# Patient Record
Sex: Male | Born: 1960 | Race: White | Hispanic: No | Marital: Married | Smoking: Light tobacco smoker
Health system: Southern US, Community
[De-identification: ages and names within clinical notes are randomized; demographics above are authoritative.]

## PROBLEM LIST (undated history)

## (undated) DIAGNOSIS — E785 Hyperlipidemia, unspecified: Secondary | ICD-10-CM

## (undated) DIAGNOSIS — G4733 Obstructive sleep apnea (adult) (pediatric): Secondary | ICD-10-CM

## (undated) DIAGNOSIS — L409 Psoriasis, unspecified: Secondary | ICD-10-CM

## (undated) DIAGNOSIS — E669 Obesity, unspecified: Secondary | ICD-10-CM

## (undated) DIAGNOSIS — R748 Abnormal levels of other serum enzymes: Secondary | ICD-10-CM

## (undated) DIAGNOSIS — I2119 ST elevation (STEMI) myocardial infarction involving other coronary artery of inferior wall: Secondary | ICD-10-CM

## (undated) DIAGNOSIS — I219 Acute myocardial infarction, unspecified: Secondary | ICD-10-CM

## (undated) DIAGNOSIS — I10 Essential (primary) hypertension: Secondary | ICD-10-CM

## (undated) HISTORY — DX: ST elevation (STEMI) myocardial infarction involving other coronary artery of inferior wall: I21.19

## (undated) HISTORY — DX: Obesity, unspecified: E66.9

## (undated) HISTORY — DX: Abnormal levels of other serum enzymes: R74.8

## (undated) HISTORY — DX: Essential (primary) hypertension: I10

## (undated) HISTORY — DX: Obstructive sleep apnea (adult) (pediatric): G47.33

## (undated) HISTORY — DX: Hyperlipidemia, unspecified: E78.5

## (undated) HISTORY — DX: Psoriasis, unspecified: L40.9

---
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
## 1993-07-14 HISTORY — PX: VASECTOMY: SHX75

## 1998-07-14 HISTORY — PX: HERNIA REPAIR: SHX51

## 2004-07-14 DIAGNOSIS — R748 Abnormal levels of other serum enzymes: Secondary | ICD-10-CM

## 2004-07-14 HISTORY — DX: Abnormal levels of other serum enzymes: R74.8

## 2004-12-28 ENCOUNTER — Emergency Department: Payer: Self-pay | Admitting: Emergency Medicine

## 2004-12-30 ENCOUNTER — Ambulatory Visit: Payer: Self-pay | Admitting: Emergency Medicine

## 2006-01-08 ENCOUNTER — Ambulatory Visit: Payer: Self-pay | Admitting: Cardiovascular Disease

## 2006-03-11 ENCOUNTER — Encounter: Payer: Self-pay | Admitting: Cardiovascular Disease

## 2006-03-14 ENCOUNTER — Encounter: Payer: Self-pay | Admitting: Cardiovascular Disease

## 2006-04-13 ENCOUNTER — Encounter: Payer: Self-pay | Admitting: Cardiovascular Disease

## 2006-04-29 ENCOUNTER — Ambulatory Visit: Payer: Self-pay | Admitting: Gastroenterology

## 2006-05-14 ENCOUNTER — Encounter: Payer: Self-pay | Admitting: Cardiovascular Disease

## 2006-06-13 ENCOUNTER — Encounter: Payer: Self-pay | Admitting: Cardiovascular Disease

## 2007-10-25 ENCOUNTER — Other Ambulatory Visit: Payer: Self-pay

## 2007-10-26 ENCOUNTER — Inpatient Hospital Stay: Payer: Self-pay | Admitting: Internal Medicine

## 2007-12-02 ENCOUNTER — Ambulatory Visit: Payer: Self-pay | Admitting: Surgery

## 2009-02-11 HISTORY — PX: COLONOSCOPY: SHX174

## 2009-03-12 LAB — HM COLONOSCOPY: HM Colonoscopy: NORMAL

## 2009-04-24 ENCOUNTER — Inpatient Hospital Stay: Payer: Self-pay | Admitting: Internal Medicine

## 2009-06-21 ENCOUNTER — Ambulatory Visit: Payer: Self-pay | Admitting: Gastroenterology

## 2009-10-26 ENCOUNTER — Ambulatory Visit: Payer: Self-pay | Admitting: Internal Medicine

## 2010-06-21 ENCOUNTER — Other Ambulatory Visit: Payer: Self-pay | Admitting: Internal Medicine

## 2011-01-23 ENCOUNTER — Other Ambulatory Visit: Payer: Self-pay | Admitting: Internal Medicine

## 2011-03-13 ENCOUNTER — Ambulatory Visit (INDEPENDENT_AMBULATORY_CARE_PROVIDER_SITE_OTHER): Payer: BC Managed Care – PPO | Admitting: Internal Medicine

## 2011-03-13 ENCOUNTER — Encounter: Payer: Self-pay | Admitting: Internal Medicine

## 2011-03-13 VITALS — BP 121/75 | HR 72 | Temp 98.5°F | Resp 16 | Ht 71.0 in | Wt 346.0 lb

## 2011-03-13 DIAGNOSIS — E1165 Type 2 diabetes mellitus with hyperglycemia: Secondary | ICD-10-CM | POA: Insufficient documentation

## 2011-03-13 DIAGNOSIS — E1159 Type 2 diabetes mellitus with other circulatory complications: Secondary | ICD-10-CM | POA: Insufficient documentation

## 2011-03-13 DIAGNOSIS — E119 Type 2 diabetes mellitus without complications: Secondary | ICD-10-CM

## 2011-03-13 DIAGNOSIS — Z6841 Body Mass Index (BMI) 40.0 and over, adult: Secondary | ICD-10-CM | POA: Insufficient documentation

## 2011-03-13 DIAGNOSIS — I10 Essential (primary) hypertension: Secondary | ICD-10-CM | POA: Insufficient documentation

## 2011-03-13 DIAGNOSIS — E785 Hyperlipidemia, unspecified: Secondary | ICD-10-CM

## 2011-03-13 DIAGNOSIS — E669 Obesity, unspecified: Secondary | ICD-10-CM

## 2011-03-13 DIAGNOSIS — J329 Chronic sinusitis, unspecified: Secondary | ICD-10-CM

## 2011-03-13 MED ORDER — AMOXICILLIN-POT CLAVULANATE 875-125 MG PO TABS: 1.0000 | ORAL_TABLET | Freq: Two times a day (BID) | ORAL | Status: AC

## 2011-03-13 NOTE — Patient Instructions (Signed)

## 2011-03-13 NOTE — Progress Notes (Signed)
Subjective:    Patient ID: Adam Patterson, male    DOB: 17-Mar-1961, 50 y.o.   MRN: [id number]  Sinusitis This is a new problem. The current episode started 1 to 4 weeks ago. The problem has been waxing and waning since onset. There has been no fever. The pain is moderate. Associated symptoms include congestion, coughing, ear pain, headaches, sinus pressure and a sore throat. Pertinent negatives include no chills or shortness of breath. Past treatments include acetaminophen.     Outpatient Encounter Prescriptions as of 03/13/2011  Medication Sig Dispense Refill  . Amlodipine Besylate-Valsartan (EXFORGE PO) Take 1 tablet by mouth daily.        Marland Kitchen aspirin 81 MG tablet Take 81 mg by mouth daily.        . DULoxetine HCl (CYMBALTA PO) Take 1 tablet by mouth daily.        . fish oil-omega-3 fatty acids 1000 MG capsule Take 2 g by mouth 2 (two) times daily.        Marland Kitchen GARLIC OIL PO Take 1 tablet by mouth daily.        . Glucosamine-Chondroitin (GLUCOSAMINE CHONDR COMPLEX PO) Take 1 tablet by mouth daily.        . Liraglutide (VICTOZA [place]) Inject 1 Units into the skin daily.        . Multiple Vitamin (MULTIVITAMIN PO) Take 1 tablet by mouth daily.        . Rosuvastatin Calcium (CRESTOR PO) Take 1 tablet by mouth daily.        . Saxagliptin-Metformin (KOMBIGLYZE XR PO) Take 1 tablet by mouth 2 (two) times daily.         Review of Systems  Constitutional: Negative for fever, chills and fatigue.  HENT: Positive for ear pain, congestion, sore throat and sinus pressure.   Respiratory: Positive for cough. Negative for chest tightness and shortness of breath.   Cardiovascular: Negative for chest pain and leg swelling.  Neurological: Positive for headaches.   BP 121/75  Pulse 72  Temp(Src) 98.5 F (36.9 C) (Oral)  Resp 16  Ht 5\' 11"  (1.803 m)  Wt 346 lb (156.945 kg)  BMI 48.26 kg/m2  SpO2 98%     Objective:   Physical Exam  Constitutional: He is oriented to person, place, and time. He appears  well-developed and well-nourished. No distress.  HENT:  Head: Normocephalic and atraumatic.  Right Ear: Hearing, external ear and ear canal normal. Tympanic membrane is erythematous.  Left Ear: Hearing, external ear and ear canal normal. Tympanic membrane is erythematous.  Nose: Mucosal edema present. Right sinus exhibits maxillary sinus tenderness. Left sinus exhibits maxillary sinus tenderness.  Mouth/Throat: Oropharynx is clear and moist. No oropharyngeal exudate.  Eyes: Conjunctivae and EOM are normal. Pupils are equal, round, and reactive to light. Right eye exhibits no discharge. Left eye exhibits no discharge. No scleral icterus.  Neck: Normal range of motion. Neck supple. No tracheal deviation present. No thyromegaly present.  Cardiovascular: Normal rate, regular rhythm and normal heart sounds.  Exam reveals no gallop and no friction rub.   No murmur heard. Pulmonary/Chest: Effort normal and breath sounds normal. No respiratory distress. He has no wheezes. He has no rales. He exhibits no tenderness.  Musculoskeletal: Normal range of motion. He exhibits no edema.  Lymphadenopathy:    He has no cervical adenopathy.  Neurological: He is alert and oriented to person, place, and time. No cranial nerve deficit. Coordination normal.  Skin: Skin is warm and dry. No rash  noted. He is not diaphoretic. No erythema. No pallor.  Psychiatric: He has a normal mood and affect. His behavior is normal. Judgment and thought content normal.          Assessment & Plan:  1. Sinusitis - patient with a several week history of bilateral facial pain, purulent rhinorrhea, fatigue. Exam is remarkable for tenderness over the bilateral maxillary sinuses. Will treat empirically for sinusitis with Augmentin x10 days. Patient will continue as needed Tylenol or ibuprofen for pain. He will call or return to clinic should symptoms not improve.

## 2011-03-31 ENCOUNTER — Ambulatory Visit: Payer: Self-pay | Admitting: Internal Medicine

## 2011-03-31 ENCOUNTER — Encounter: Payer: Self-pay | Admitting: Internal Medicine

## 2011-03-31 ENCOUNTER — Ambulatory Visit (INDEPENDENT_AMBULATORY_CARE_PROVIDER_SITE_OTHER): Payer: BC Managed Care – PPO | Admitting: Internal Medicine

## 2011-03-31 VITALS — BP 111/75 | HR 93 | Temp 98.8°F | Resp 16 | Ht 71.0 in | Wt 314.2 lb

## 2011-03-31 DIAGNOSIS — J40 Bronchitis, not specified as acute or chronic: Secondary | ICD-10-CM

## 2011-03-31 DIAGNOSIS — R51 Headache: Secondary | ICD-10-CM

## 2011-03-31 MED ORDER — HYDROCODONE-ACETAMINOPHEN 5-500 MG PO TABS: 1.0000 | ORAL_TABLET | ORAL | Status: AC | PRN

## 2011-03-31 NOTE — Progress Notes (Signed)
Subjective:    Patient ID: Adam Patterson, male    DOB: [birth year], 50 y.o.   MRN: [id number]  HPI  Mr. Paparella presents with persistent malaise, headache, neck pain and pleuritic cough.  He as treated 2 weeks ago for sinusitis but did not take meds as directed.  Aslo reports diarrhea for the last 3 days, nonwatery, and low grade fevers.  Headache entire head,  Cough with pleuritic chest pain.  Has come in contact with mono at school and wife at work. Started new teaching job 3 weeks ago.   Past Medical History  Diagnosis Date  . Diabetes mellitus   . Hypertension   . Hyperlipidemia   . Sleep apnea sleep study 2007    obstructive, not using mask  . Obesity   . Abnormal liver enzymes 2006    no prior workup  . Psoriasis     affecting hands (skin)   Current Outpatient Prescriptions on File Prior to Visit  Medication Sig Dispense Refill  . aspirin 81 MG tablet Take 81 mg by mouth daily.        . fish oil-omega-3 fatty acids 1000 MG capsule Take 2 g by mouth 2 (two) times daily.        Marland Kitchen GARLIC OIL PO Take 1 tablet by mouth daily.        . Glucosamine-Chondroitin (GLUCOSAMINE CHONDR COMPLEX PO) Take 1 tablet by mouth daily.        . Multiple Vitamin (MULTIVITAMIN PO) Take 1 tablet by mouth daily.         Review of Systems  Constitutional: Positive for chills and fatigue. Negative for fever, diaphoresis, activity change, appetite change and unexpected weight change.  HENT: Positive for neck pain. Negative for hearing loss, ear pain, nosebleeds, congestion, sore throat, facial swelling, rhinorrhea, sneezing, drooling, mouth sores, trouble swallowing, neck stiffness, dental problem, voice change, postnasal drip, sinus pressure, tinnitus and ear discharge.   Eyes: Positive for photophobia. Negative for pain, discharge, redness, itching and visual disturbance.  Respiratory: Positive for cough. Negative for apnea, choking, chest tightness, shortness of breath, wheezing and stridor.     Cardiovascular: Positive for chest pain. Negative for palpitations and leg swelling.  Gastrointestinal: Negative for nausea, vomiting, abdominal pain, diarrhea, constipation, blood in stool, abdominal distention, anal bleeding and rectal pain.  Genitourinary: Negative for dysuria, urgency, frequency, hematuria, flank pain, decreased urine volume, scrotal swelling, difficulty urinating and testicular pain.  Musculoskeletal: Negative for myalgias, back pain, joint swelling, arthralgias and gait problem.  Skin: Positive for pallor. Negative for color change, rash and wound.  Neurological: Positive for headaches. Negative for dizziness, tremors, seizures, syncope, speech difficulty, weakness, light-headedness and numbness.  Hematological: Positive for adenopathy.  Psychiatric/Behavioral: Negative for suicidal ideas, hallucinations, behavioral problems, confusion, sleep disturbance, dysphoric mood, decreased concentration and agitation. The patient is not nervous/anxious.        Objective:   Physical Exam  Constitutional: He is oriented to person, place, and time.  HENT:  Right Ear: External ear normal.  Left Ear: External ear normal.  Eyes: Conjunctivae are normal. Pupils are equal, round, and reactive to light.  Neck: Normal range of motion. Neck supple.  Cardiovascular: Normal rate and normal heart sounds.   Pulmonary/Chest: Effort normal and breath sounds normal.  Abdominal: Soft. Bowel sounds are normal.  Lymphadenopathy:    He has cervical adenopathy.  Neurological: He is alert and oriented to person, place, and time.  Skin: He is diaphoretic. There is pallor.  Psychiatric: He  has a normal mood and affect. His behavior is normal.          Assessment & Plan:  Headache, malaise, pleuritic chest pain: concernign for viral syndrome vs pneumonitis.  He was treated 14 days ago with augmentin for sinusitis but did not take the medications as prescribed. wWill check CXR, rule out  influenza and mononucleosis.

## 2011-03-31 NOTE — Patient Instructions (Signed)
You appear to be dehydrated and suffering from  either pneumonia or a viral syndrome (mono or the flu).  Rest  Lots of fluids, vicodin for pain and  cough control.  I will call in abx if indicated by labs and c xray

## 2011-04-01 ENCOUNTER — Telehealth: Payer: Self-pay | Admitting: Internal Medicine

## 2011-04-01 MED ORDER — LEVOFLOXACIN 500 MG PO TABS: 500.0000 mg | ORAL_TABLET | Freq: Every day | ORAL | Status: AC

## 2011-04-01 NOTE — Telephone Encounter (Signed)
Pam notified patient today

## 2011-04-01 NOTE — Telephone Encounter (Signed)
PT WOULD LIKE TO GET HIS XRAY AND LABS RESULTS

## 2011-04-01 NOTE — Progress Notes (Signed)
Addended by: Duncan Dull on: 04/01/2011 11:08 AM   Modules accepted: Orders

## 2011-04-02 ENCOUNTER — Telehealth: Payer: Self-pay | Admitting: *Deleted

## 2011-04-02 ENCOUNTER — Encounter: Payer: Self-pay | Admitting: Internal Medicine

## 2011-04-02 NOTE — Telephone Encounter (Signed)
On your printer, signed

## 2011-04-02 NOTE — Telephone Encounter (Signed)
Patient notified. Letter placed up front for pick up.  

## 2011-04-02 NOTE — Telephone Encounter (Signed)
Patient is asking if he get another note to excuse him from work through Friday because he says that he would like to have this week and weekend to get better and start on MOnday. He is asking that someone call him when it is ready and his wife will come and pick it up.

## 2011-04-06 NOTE — Progress Notes (Signed)
Addended by: Duncan Dull on: 04/06/2011 06:10 PM   Modules accepted: Orders

## 2011-04-17 ENCOUNTER — Other Ambulatory Visit: Payer: Self-pay | Admitting: Internal Medicine

## 2011-04-18 ENCOUNTER — Other Ambulatory Visit: Payer: Self-pay | Admitting: Internal Medicine

## 2011-04-18 MED ORDER — LIRAGLUTIDE 18 MG/3ML ~~LOC~~ SOLN: 1.8000 mg | Freq: Every day | SUBCUTANEOUS | Status: DC

## 2011-04-18 MED ORDER — AMLODIPINE BESYLATE-VALSARTAN 10-320 MG PO TABS: 1.0000 | ORAL_TABLET | Freq: Every day | ORAL | Status: DC

## 2011-04-18 MED ORDER — INSULIN PEN NEEDLE 30G X 8 MM MISC: Status: DC

## 2011-04-23 ENCOUNTER — Encounter: Payer: Self-pay | Admitting: Internal Medicine

## 2011-04-24 ENCOUNTER — Encounter: Payer: Self-pay | Admitting: Internal Medicine

## 2011-07-04 ENCOUNTER — Other Ambulatory Visit: Payer: BC Managed Care – PPO

## 2011-07-11 ENCOUNTER — Encounter: Payer: BC Managed Care – PPO | Admitting: Internal Medicine

## 2011-09-24 ENCOUNTER — Other Ambulatory Visit: Payer: Self-pay | Admitting: Internal Medicine

## 2011-09-24 MED ORDER — DULOXETINE HCL 60 MG PO CPEP: 60.0000 mg | ORAL_CAPSULE | Freq: Every day | ORAL | Status: DC

## 2013-04-07 ENCOUNTER — Emergency Department: Payer: Self-pay | Admitting: Emergency Medicine

## 2015-09-14 DIAGNOSIS — Z7721 Contact with and (suspected) exposure to potentially hazardous body fluids: Secondary | ICD-10-CM | POA: Diagnosis not present

## 2015-09-14 DIAGNOSIS — I10 Essential (primary) hypertension: Secondary | ICD-10-CM | POA: Diagnosis not present

## 2015-09-14 DIAGNOSIS — R5383 Other fatigue: Secondary | ICD-10-CM | POA: Diagnosis not present

## 2015-09-14 DIAGNOSIS — R109 Unspecified abdominal pain: Secondary | ICD-10-CM | POA: Diagnosis not present

## 2015-09-14 DIAGNOSIS — E7800 Pure hypercholesterolemia, unspecified: Secondary | ICD-10-CM | POA: Diagnosis not present

## 2015-09-14 DIAGNOSIS — F419 Anxiety disorder, unspecified: Secondary | ICD-10-CM | POA: Diagnosis not present

## 2015-09-14 DIAGNOSIS — E119 Type 2 diabetes mellitus without complications: Secondary | ICD-10-CM | POA: Diagnosis not present

## 2015-09-14 DIAGNOSIS — E785 Hyperlipidemia, unspecified: Secondary | ICD-10-CM | POA: Diagnosis not present

## 2015-09-14 DIAGNOSIS — E559 Vitamin D deficiency, unspecified: Secondary | ICD-10-CM | POA: Diagnosis not present

## 2015-09-15 DIAGNOSIS — F339 Major depressive disorder, recurrent, unspecified: Secondary | ICD-10-CM | POA: Diagnosis not present

## 2015-12-17 DIAGNOSIS — R5383 Other fatigue: Secondary | ICD-10-CM | POA: Diagnosis not present

## 2015-12-17 DIAGNOSIS — E119 Type 2 diabetes mellitus without complications: Secondary | ICD-10-CM | POA: Diagnosis not present

## 2015-12-17 DIAGNOSIS — I10 Essential (primary) hypertension: Secondary | ICD-10-CM | POA: Diagnosis not present

## 2015-12-17 DIAGNOSIS — E669 Obesity, unspecified: Secondary | ICD-10-CM | POA: Diagnosis not present

## 2015-12-17 DIAGNOSIS — E7800 Pure hypercholesterolemia, unspecified: Secondary | ICD-10-CM | POA: Diagnosis not present

## 2015-12-17 DIAGNOSIS — J449 Chronic obstructive pulmonary disease, unspecified: Secondary | ICD-10-CM | POA: Diagnosis not present

## 2015-12-17 DIAGNOSIS — F329 Major depressive disorder, single episode, unspecified: Secondary | ICD-10-CM | POA: Diagnosis not present

## 2015-12-17 DIAGNOSIS — E785 Hyperlipidemia, unspecified: Secondary | ICD-10-CM | POA: Diagnosis not present

## 2015-12-17 DIAGNOSIS — E559 Vitamin D deficiency, unspecified: Secondary | ICD-10-CM | POA: Diagnosis not present

## 2016-02-04 ENCOUNTER — Encounter: Payer: Self-pay | Admitting: Emergency Medicine

## 2016-02-04 ENCOUNTER — Other Ambulatory Visit: Payer: Self-pay

## 2016-02-04 ENCOUNTER — Encounter (HOSPITAL_COMMUNITY)
Admission: RE | Disposition: A | Discharge status: Home / Self Care | Payer: Self-pay | Source: Home / Self Care | Attending: Cardiology

## 2016-02-04 ENCOUNTER — Emergency Department
Admission: EM | Admit: 2016-02-04 | Discharge: 2016-02-04 | Disposition: A | Discharge status: Other Acute Inpatient Hospital | Payer: 59 | Attending: Emergency Medicine | Admitting: Emergency Medicine

## 2016-02-04 ENCOUNTER — Inpatient Hospital Stay (HOSPITAL_COMMUNITY)
Admission: RE | Admit: 2016-02-04 | Discharge: 2016-02-05 | DRG: 246 | Disposition: A | Discharge status: Home / Self Care | Payer: 59 | Attending: Cardiology | Admitting: Cardiology

## 2016-02-04 ENCOUNTER — Encounter (HOSPITAL_COMMUNITY): Payer: Self-pay | Admitting: *Deleted

## 2016-02-04 DIAGNOSIS — Z87891 Personal history of nicotine dependence: Secondary | ICD-10-CM | POA: Diagnosis not present

## 2016-02-04 DIAGNOSIS — Z794 Long term (current) use of insulin: Secondary | ICD-10-CM | POA: Insufficient documentation

## 2016-02-04 DIAGNOSIS — I2510 Atherosclerotic heart disease of native coronary artery without angina pectoris: Secondary | ICD-10-CM | POA: Clinically undetermined

## 2016-02-04 DIAGNOSIS — E1165 Type 2 diabetes mellitus with hyperglycemia: Secondary | ICD-10-CM

## 2016-02-04 DIAGNOSIS — Z79899 Other long term (current) drug therapy: Secondary | ICD-10-CM | POA: Insufficient documentation

## 2016-02-04 DIAGNOSIS — I462 Cardiac arrest due to underlying cardiac condition: Secondary | ICD-10-CM | POA: Diagnosis not present

## 2016-02-04 DIAGNOSIS — Z7982 Long term (current) use of aspirin: Secondary | ICD-10-CM | POA: Diagnosis not present

## 2016-02-04 DIAGNOSIS — Q245 Malformation of coronary vessels: Secondary | ICD-10-CM

## 2016-02-04 DIAGNOSIS — I10 Essential (primary) hypertension: Secondary | ICD-10-CM | POA: Insufficient documentation

## 2016-02-04 DIAGNOSIS — Z955 Presence of coronary angioplasty implant and graft: Secondary | ICD-10-CM

## 2016-02-04 DIAGNOSIS — Z6838 Body mass index (BMI) 38.0-38.9, adult: Secondary | ICD-10-CM

## 2016-02-04 DIAGNOSIS — E669 Obesity, unspecified: Secondary | ICD-10-CM | POA: Diagnosis present

## 2016-02-04 DIAGNOSIS — I119 Hypertensive heart disease without heart failure: Secondary | ICD-10-CM | POA: Diagnosis not present

## 2016-02-04 DIAGNOSIS — I4901 Ventricular fibrillation: Secondary | ICD-10-CM | POA: Diagnosis not present

## 2016-02-04 DIAGNOSIS — I25110 Atherosclerotic heart disease of native coronary artery with unstable angina pectoris: Secondary | ICD-10-CM

## 2016-02-04 DIAGNOSIS — E785 Hyperlipidemia, unspecified: Secondary | ICD-10-CM | POA: Diagnosis present

## 2016-02-04 DIAGNOSIS — R079 Chest pain, unspecified: Secondary | ICD-10-CM | POA: Diagnosis not present

## 2016-02-04 DIAGNOSIS — I2119 ST elevation (STEMI) myocardial infarction involving other coronary artery of inferior wall: Principal | ICD-10-CM | POA: Diagnosis present

## 2016-02-04 DIAGNOSIS — E119 Type 2 diabetes mellitus without complications: Secondary | ICD-10-CM | POA: Insufficient documentation

## 2016-02-04 DIAGNOSIS — I213 ST elevation (STEMI) myocardial infarction of unspecified site: Secondary | ICD-10-CM | POA: Diagnosis not present

## 2016-02-04 HISTORY — DX: ST elevation (STEMI) myocardial infarction involving other coronary artery of inferior wall: I21.19

## 2016-02-04 HISTORY — PX: CARDIAC CATHETERIZATION: SHX172

## 2016-02-04 HISTORY — DX: Acute myocardial infarction, unspecified: I21.9

## 2016-02-04 LAB — CBC
HEMATOCRIT: 42.9 % (ref 40.0–52.0)
HEMOGLOBIN: 14.5 g/dL (ref 13.0–18.0)
MCH: 28.8 pg (ref 26.0–34.0)
MCHC: 33.9 g/dL (ref 32.0–36.0)
MCV: 85 fL (ref 80.0–100.0)
Platelets: 288 10*3/uL (ref 150–440)
RBC: 5.04 MIL/uL (ref 4.40–5.90)
RDW: 14.8 % — ABNORMAL HIGH (ref 11.5–14.5)
WBC: 8.2 10*3/uL (ref 3.8–10.6)

## 2016-02-04 LAB — COMPREHENSIVE METABOLIC PANEL
ALBUMIN: 3.6 g/dL (ref 3.5–5.0)
ALK PHOS: 46 U/L (ref 38–126)
ALT: 20 U/L (ref 17–63)
ALT: 30 U/L (ref 17–63)
ANION GAP: 8 (ref 5–15)
AST: 31 U/L (ref 15–41)
AST: 125 U/L — AB (ref 15–41)
Albumin: 4.3 g/dL (ref 3.5–5.0)
Alkaline Phosphatase: 41 U/L (ref 38–126)
Anion gap: 7 (ref 5–15)
BILIRUBIN TOTAL: 0.6 mg/dL (ref 0.3–1.2)
BUN: 11 mg/dL (ref 6–20)
BUN: 8 mg/dL (ref 6–20)
CALCIUM: 9 mg/dL (ref 8.9–10.3)
CHLORIDE: 106 mmol/L (ref 101–111)
CO2: 28 mmol/L (ref 22–32)
CO2: 28 mmol/L (ref 22–32)
CREATININE: 0.89 mg/dL (ref 0.61–1.24)
CREATININE: 0.85 mg/dL (ref 0.61–1.24)
Calcium: 8.6 mg/dL — ABNORMAL LOW (ref 8.9–10.3)
Chloride: 103 mmol/L (ref 101–111)
GFR calc Af Amer: >60 mL/min (ref >60)
GFR calc non Af Amer: >60 mL/min (ref >60)
GLUCOSE: 175 mg/dL — AB (ref 65–99)
GLUCOSE: 142 mg/dL — AB (ref 65–99)
POTASSIUM: 3.6 mmol/L (ref 3.5–5.1)
Potassium: 2.9 mmol/L — ABNORMAL LOW (ref 3.5–5.1)
SODIUM: 139 mmol/L (ref 135–145)
Sodium: 141 mmol/L (ref 135–145)
TOTAL PROTEIN: 7.4 g/dL (ref 6.5–8.1)
Total Bilirubin: 0.9 mg/dL (ref 0.3–1.2)
Total Protein: 6.5 g/dL (ref 6.5–8.1)

## 2016-02-04 LAB — PROTIME-INR
INR: 0.91
PROTHROMBIN TIME: 12.5 s (ref 11.4–15.0)

## 2016-02-04 LAB — LIPID PANEL
CHOL/HDL RATIO: 2.1 ratio
CHOLESTEROL: 113 mg/dL (ref 0–200)
HDL: 54 mg/dL (ref >40)
LDL Cholesterol: 49 mg/dL (ref 0–99)
TRIGLYCERIDES: 48 mg/dL (ref <150)
VLDL: 10 mg/dL (ref 0–40)

## 2016-02-04 LAB — GLUCOSE, CAPILLARY
GLUCOSE-CAPILLARY: 171 mg/dL — AB (ref 65–99)
GLUCOSE-CAPILLARY: 165 mg/dL — AB (ref 65–99)
Glucose-Capillary: 153 mg/dL — ABNORMAL HIGH (ref 65–99)

## 2016-02-04 LAB — POCT ACTIVATED CLOTTING TIME
ACTIVATED CLOTTING TIME: 224 s
ACTIVATED CLOTTING TIME: 466 s
Activated Clotting Time: 202 seconds
Activated Clotting Time: 219 seconds

## 2016-02-04 LAB — MRSA PCR SCREENING: MRSA BY PCR: POSITIVE — AB

## 2016-02-04 LAB — APTT: aPTT: 31 s (ref 24–36)

## 2016-02-04 LAB — TROPONIN I
TROPONIN I: 21.06 ng/mL — AB (ref <0.03)
Troponin I: 57.23 ng/mL (ref <0.03)
Troponin I: 53.63 ng/mL (ref <0.03)

## 2016-02-04 SURGERY — LEFT HEART CATH AND CORONARY ANGIOGRAPHY

## 2016-02-04 MED ORDER — AMIODARONE HCL 150 MG/3ML IV SOLN
INTRAVENOUS | Status: DC | PRN
  Administered 2016-02-04: 150 mg via INTRAVENOUS

## 2016-02-04 MED ORDER — HEPARIN SODIUM (PORCINE) 1000 UNIT/ML IJ SOLN
INTRAMUSCULAR | Status: AC
  Filled 2016-02-04: qty 1

## 2016-02-04 MED ORDER — CARVEDILOL 3.125 MG PO TABS
3.1250 mg | ORAL_TABLET | Freq: Two times a day (BID) | ORAL | Status: DC
  Administered 2016-02-04 – 2016-02-05 (×3): 3.125 mg via ORAL
  Filled 2016-02-04 (×3): qty 1

## 2016-02-04 MED ORDER — SODIUM CHLORIDE 0.9 % WEIGHT BASED INFUSION: 1.0000 mL/kg/h | INTRAVENOUS | Status: DC

## 2016-02-04 MED ORDER — IOPAMIDOL (ISOVUE-370) INJECTION 76%
INTRAVENOUS | Status: DC | PRN
  Administered 2016-02-04: 200 mL via INTRAVENOUS

## 2016-02-04 MED ORDER — TICAGRELOR 90 MG PO TABS
90.0000 mg | ORAL_TABLET | Freq: Two times a day (BID) | ORAL | Status: DC
  Administered 2016-02-04 – 2016-02-05 (×2): 90 mg via ORAL
  Filled 2016-02-04 (×2): qty 1

## 2016-02-04 MED ORDER — CHLORHEXIDINE GLUCONATE CLOTH 2 % EX PADS
6.0000 | MEDICATED_PAD | Freq: Every day | CUTANEOUS | Status: DC
  Administered 2016-02-05: 6 via TOPICAL

## 2016-02-04 MED ORDER — OMEGA-3 FATTY ACIDS 1000 MG PO CAPS: 2.0000 g | ORAL_CAPSULE | Freq: Two times a day (BID) | ORAL | Status: DC

## 2016-02-04 MED ORDER — TICAGRELOR 90 MG PO TABS
180.0000 mg | ORAL_TABLET | Freq: Once | ORAL | Status: AC
  Administered 2016-02-04: 180 mg via ORAL
  Filled 2016-02-04: qty 2

## 2016-02-04 MED ORDER — LIDOCAINE HCL (PF) 1 % IJ SOLN
INTRAMUSCULAR | Status: AC
  Filled 2016-02-04: qty 30

## 2016-02-04 MED ORDER — ASPIRIN EC 81 MG PO TBEC
81.0000 mg | DELAYED_RELEASE_TABLET | Freq: Every day | ORAL | Status: DC
  Administered 2016-02-04 – 2016-02-05 (×2): 81 mg via ORAL
  Filled 2016-02-04 (×2): qty 1

## 2016-02-04 MED ORDER — AMIODARONE HCL 150 MG/3ML IV SOLN
INTRAVENOUS | Status: AC
  Filled 2016-02-04: qty 3

## 2016-02-04 MED ORDER — HYDROMORPHONE HCL 1 MG/ML IJ SOLN
INTRAMUSCULAR | Status: AC
  Filled 2016-02-04: qty 1

## 2016-02-04 MED ORDER — BIVALIRUDIN BOLUS VIA INFUSION - CUPID
INTRAVENOUS | Status: DC | PRN
  Administered 2016-02-04: 90.15 mg via INTRAVENOUS

## 2016-02-04 MED ORDER — NITROGLYCERIN 1 MG/10 ML FOR IR/CATH LAB
INTRA_ARTERIAL | Status: DC | PRN
  Administered 2016-02-04: 100 ug via INTRACORONARY
  Administered 2016-02-04: 200 ug via INTRACORONARY

## 2016-02-04 MED ORDER — IOPAMIDOL (ISOVUE-370) INJECTION 76%
INTRAVENOUS | Status: AC
  Filled 2016-02-04: qty 100

## 2016-02-04 MED ORDER — DULOXETINE HCL 60 MG PO CPEP
60.0000 mg | ORAL_CAPSULE | Freq: Every day | ORAL | Status: DC
  Administered 2016-02-04 – 2016-02-05 (×2): 60 mg via ORAL
  Filled 2016-02-04 (×2): qty 1

## 2016-02-04 MED ORDER — LIDOCAINE HCL (PF) 1 % IJ SOLN
INTRAMUSCULAR | Status: DC | PRN
  Administered 2016-02-04: 20 mL

## 2016-02-04 MED ORDER — SODIUM CHLORIDE 0.9 % IV SOLN
INTRAVENOUS | Status: DC | PRN
  Administered 2016-02-04: 250 mL via INTRAVENOUS

## 2016-02-04 MED ORDER — HEPARIN (PORCINE) IN NACL 2-0.9 UNIT/ML-% IJ SOLN
INTRAMUSCULAR | Status: AC
  Filled 2016-02-04: qty 1500

## 2016-02-04 MED ORDER — LIRAGLUTIDE 18 MG/3ML ~~LOC~~ SOLN: 1.8000 mg | Freq: Every day | SUBCUTANEOUS | Status: DC

## 2016-02-04 MED ORDER — HEPARIN SODIUM (PORCINE) 5000 UNIT/ML IJ SOLN
4000.0000 [IU] | Freq: Once | INTRAMUSCULAR | Status: AC
  Administered 2016-02-04: 4000 [IU] via INTRAVENOUS

## 2016-02-04 MED ORDER — SODIUM CHLORIDE 0.9% FLUSH
3.0000 mL | Freq: Two times a day (BID) | INTRAVENOUS | Status: DC
  Administered 2016-02-04 – 2016-02-05 (×3): 3 mL via INTRAVENOUS

## 2016-02-04 MED ORDER — INSULIN ASPART 100 UNIT/ML ~~LOC~~ SOLN
0.0000 [IU] | Freq: Three times a day (TID) | SUBCUTANEOUS | Status: DC
  Administered 2016-02-04 – 2016-02-05 (×3): 3 [IU] via SUBCUTANEOUS

## 2016-02-04 MED ORDER — SODIUM CHLORIDE 0.9% FLUSH: 3.0000 mL | INTRAVENOUS | Status: DC | PRN

## 2016-02-04 MED ORDER — CEFAZOLIN IN D5W 1 GM/50ML IV SOLN
INTRAVENOUS | Status: AC
  Filled 2016-02-04: qty 50

## 2016-02-04 MED ORDER — ONDANSETRON HCL 4 MG/2ML IJ SOLN
INTRAMUSCULAR | Status: DC | PRN
  Administered 2016-02-04: 4 mg via INTRAVENOUS

## 2016-02-04 MED ORDER — BIVALIRUDIN 250 MG IV SOLR
INTRAVENOUS | Status: AC
  Filled 2016-02-04: qty 250

## 2016-02-04 MED ORDER — AMLODIPINE BESYLATE 10 MG PO TABS
10.0000 mg | ORAL_TABLET | Freq: Every day | ORAL | Status: DC
Start: 2016-02-04 — End: 2016-02-05
  Administered 2016-02-04 – 2016-02-05 (×2): 10 mg via ORAL
  Filled 2016-02-04 (×2): qty 1

## 2016-02-04 MED ORDER — HYDROMORPHONE HCL 1 MG/ML IJ SOLN
INTRAMUSCULAR | Status: DC | PRN
  Administered 2016-02-04 (×2): 0.5 mg via INTRAVENOUS

## 2016-02-04 MED ORDER — HEPARIN (PORCINE) IN NACL 100-0.45 UNIT/ML-% IJ SOLN: 1200.0000 [IU]/h | INTRAMUSCULAR | Status: DC

## 2016-02-04 MED ORDER — MUPIROCIN 2 % EX OINT
1.0000 "application " | TOPICAL_OINTMENT | Freq: Two times a day (BID) | CUTANEOUS | Status: DC
  Administered 2016-02-04 – 2016-02-05 (×3): 1 via NASAL
  Filled 2016-02-04 (×2): qty 22

## 2016-02-04 MED ORDER — MIDAZOLAM HCL 2 MG/2ML IJ SOLN
INTRAMUSCULAR | Status: DC | PRN
  Administered 2016-02-04: 2 mg via INTRAVENOUS

## 2016-02-04 MED ORDER — NITROGLYCERIN 1 MG/10 ML FOR IR/CATH LAB
INTRA_ARTERIAL | Status: AC
  Filled 2016-02-04: qty 10

## 2016-02-04 MED ORDER — HEPARIN (PORCINE) IN NACL 2-0.9 UNIT/ML-% IJ SOLN
INTRAMUSCULAR | Status: DC | PRN
  Administered 2016-02-04: 1500 mL

## 2016-02-04 MED ORDER — OMEGA-3-ACID ETHYL ESTERS 1 G PO CAPS
2.0000 g | ORAL_CAPSULE | Freq: Two times a day (BID) | ORAL | Status: DC
  Administered 2016-02-04 – 2016-02-05 (×3): 2 g via ORAL
  Filled 2016-02-04 (×3): qty 2

## 2016-02-04 MED ORDER — HEPARIN SODIUM (PORCINE) 1000 UNIT/ML IJ SOLN
INTRAMUSCULAR | Status: DC | PRN
  Administered 2016-02-04: 10000 [IU] via INTRAVENOUS
  Administered 2016-02-04: 3000 [IU] via INTRAVENOUS
  Administered 2016-02-04 (×2): 2000 [IU] via INTRAVENOUS

## 2016-02-04 MED ORDER — SODIUM CHLORIDE 0.9 % IV SOLN: 250.0000 mL | INTRAVENOUS | Status: DC | PRN

## 2016-02-04 MED ORDER — ROSUVASTATIN CALCIUM 20 MG PO TABS
20.0000 mg | ORAL_TABLET | Freq: Every day | ORAL | Status: DC
  Administered 2016-02-04 – 2016-02-05 (×2): 20 mg via ORAL
  Filled 2016-02-04 (×2): qty 1

## 2016-02-04 MED ORDER — MIDAZOLAM HCL 2 MG/2ML IJ SOLN
INTRAMUSCULAR | Status: AC
  Filled 2016-02-04: qty 2

## 2016-02-04 MED ORDER — IOPAMIDOL (ISOVUE-370) INJECTION 76%
INTRAVENOUS | Status: AC
  Filled 2016-02-04: qty 125

## 2016-02-04 MED ORDER — NITROGLYCERIN 0.4 MG SL SUBL: 0.4000 mg | SUBLINGUAL_TABLET | SUBLINGUAL | Status: DC | PRN

## 2016-02-04 MED ORDER — VITAMIN D 1000 UNITS PO TABS
1000.0000 [IU] | ORAL_TABLET | Freq: Every day | ORAL | Status: DC
  Administered 2016-02-04 – 2016-02-05 (×2): 1000 [IU] via ORAL
  Filled 2016-02-04 (×2): qty 1

## 2016-02-04 MED ORDER — ASPIRIN 81 MG PO CHEW
81.0000 mg | CHEWABLE_TABLET | Freq: Once | ORAL | Status: AC
  Administered 2016-02-04: 81 mg via ORAL

## 2016-02-04 MED ORDER — AMLODIPINE BESYLATE-VALSARTAN 10-320 MG PO TABS: 1.0000 | ORAL_TABLET | Freq: Every day | ORAL | Status: DC

## 2016-02-04 MED ORDER — CEFAZOLIN IN D5W 1 GM/50ML IV SOLN
INTRAVENOUS | Status: DC | PRN
  Administered 2016-02-04: 1 g via INTRAVENOUS

## 2016-02-04 MED ORDER — SODIUM CHLORIDE 0.9 % IV SOLN
INTRAVENOUS | Status: DC | PRN
  Administered 2016-02-04: 1.75 mg/kg/h via INTRAVENOUS

## 2016-02-04 MED ORDER — IRBESARTAN 300 MG PO TABS
300.0000 mg | ORAL_TABLET | Freq: Every day | ORAL | Status: DC
  Administered 2016-02-04 – 2016-02-05 (×2): 300 mg via ORAL
  Filled 2016-02-04 (×2): qty 1

## 2016-02-04 MED ORDER — ONDANSETRON HCL 4 MG/2ML IJ SOLN
INTRAMUSCULAR | Status: AC
  Filled 2016-02-04: qty 2

## 2016-02-04 MED ORDER — ONDANSETRON HCL 4 MG/2ML IJ SOLN: 4.0000 mg | Freq: Four times a day (QID) | INTRAMUSCULAR | Status: DC | PRN

## 2016-02-04 SURGICAL SUPPLY — 19 items
BALLN EMERGE MR 3.0X30 (BALLOONS) ×2 IMPLANT
BALLN MINITREK RX 2.0X12 (BALLOONS) ×2
BALLOON MINITREK RX 2.0X12 (BALLOONS) ×1 IMPLANT
CATH EXTRAC PRONTO LP 6F RND (CATHETERS) ×2 IMPLANT
CATH INFINITI 5FR MULTPACK ANG (CATHETERS) ×2 IMPLANT
CATH INFINITI 6F MPB2 (CATHETERS) ×2 IMPLANT
CATH VISTA GUIDE 6FR JR4 (CATHETERS) ×4 IMPLANT
DEVICE CLOSURE PERCLS PRGLD 6F (VASCULAR PRODUCTS) ×1 IMPLANT
GUIDELINER 6F (CATHETERS) ×2 IMPLANT
KIT ENCORE 26 ADVANTAGE (KITS) ×2 IMPLANT
PACK CARDIAC CATHETERIZATION (CUSTOM PROCEDURE TRAY) ×2 IMPLANT
PERCLOSE PROGLIDE 6F (VASCULAR PRODUCTS) ×2
SHEATH PINNACLE 6F 10CM (SHEATH) ×2 IMPLANT
STENT RESOLUTE INTEG 2.5X26 (Permanent Stent) ×2 IMPLANT
STENT RESOLUTE INTEG 3.5X18 (Permanent Stent) ×2 IMPLANT
STENT RESOLUTE INTEG 3.5X38 (Permanent Stent) ×2 IMPLANT
TUBING CIL FLEX 10 FLL-RA (TUBING) ×2 IMPLANT
WIRE ASAHI PROWATER 180CM (WIRE) ×2 IMPLANT
WIRE EMERALD 3MM-J .035X150CM (WIRE) ×2 IMPLANT

## 2016-02-04 NOTE — H&P (Signed)
Adam Patterson is an 55 y.o. male.   Chief Complaint: Chest pain HPI: Adam Patterson  is a 55 y.o. male  With history of hyperlipidemia, diabetes mellitus, hypertension, has history of anomalous coronary artery origin who is admitted with severe crushing chest pain that woke him up around 3:30 AM. He was initially evaluated at [facility], EKG had revealed ST elevation inferiorly with reciprocal ST depression in 1 and aVL. Due to ongoing chest discomfort he was emergently transferred to [facility] for further evaluation and management.  Patient states that chest pain was radiating to his jaws, was associated with diaphoresis and nausea. He denies symptoms of claudication or TIA. States that his diabetes is well controlled.  Past Medical History:  Diagnosis Date  . Abnormal liver enzymes 2006   no prior workup  . Diabetes mellitus   . Hyperlipidemia   . Hypertension   . Obesity   . Psoriasis    affecting hands (skin)  . Sleep apnea sleep study 2007   obstructive, not using mask    Past Surgical History:  Procedure Laterality Date  . CARDIAC CATHETERIZATION    . HERNIA REPAIR      Family History  Problem Relation Age of Onset  . BRCA 1/2 Mother 95  . Diabetes Mother   . Diabetes Maternal Grandmother    Social History:  reports that he quit smoking about 5 years ago. His smoking use included Pipe. He has never used smokeless tobacco. He reports that he drinks alcohol. He reports that he does not use drugs.  Allergies: No Known Allergies  Review of Systems - Other systems negative.  VItals: Afebrile, pulse rate 65 beats a minute, regular. Respiration 16, blood pressure 131/85 mmHg.  SpO2 100 %. General appearance: alert, cooperative, appears older than stated age, mild distress and morbidly obese Eyes: negative findings: lids and lashes normal Neck: no adenopathy, no carotid bruit, no JVD, supple, symmetrical, trachea midline and thyroid  not enlarged, symmetric, no tenderness/mass/nodules Neck: JVP - normal, carotids 2+= without bruits Resp: clear to auscultation bilaterally Chest wall: no tenderness Cardio: regular rate and rhythm, S1, S2 normal, no murmur, click, rub or gallop GI: soft, non-tender; bowel sounds normal; no masses,  no organomegaly and Obese with small pannus Extremities: extremities normal, atraumatic, no cyanosis or edema Pulses: 2+ and symmetric Skin: Skin color, texture, turgor normal. No rashes or lesions Neurologic: Grossly normal  Results for orders placed or performed during the hospital encounter of 02/04/16 (from the past 48 hour(s))  APTT     Status: None   Collection Time: 02/04/16  4:40 AM  Result Value Ref Range   aPTT 31 24 - 36 seconds  Protime-INR     Status: None   Collection Time: 02/04/16  4:40 AM  Result Value Ref Range   Prothrombin Time 12.5 11.4 - 15.0 seconds   INR 0.91   CBC     Status: Abnormal   Collection Time: 02/04/16  4:40 AM  Result Value Ref Range   WBC 8.2 3.8 - 10.6 K/uL   RBC 5.04 4.40 - 5.90 MIL/uL   Hemoglobin 14.5 13.0 - 18.0 g/dL   HCT 42.9 40.0 - 52.0 %   MCV 85.0 80.0 - 100.0 fL   MCH 28.8 26.0 - 34.0 pg   MCHC 33.9 32.0 - 36.0 g/dL   RDW 14.8 (H) 11.5 - 14.5 %   Platelets 288 150 - 440 K/uL  Comprehensive metabolic panel  Status: Abnormal   Collection Time: 02/04/16  4:40 AM  Result Value Ref Range   Sodium 139 135 - 145 mmol/L   Potassium 2.9 (L) 3.5 - 5.1 mmol/L   Chloride 103 101 - 111 mmol/L   CO2 28 22 - 32 mmol/L   Glucose, Bld 175 (H) 65 - 99 mg/dL   BUN 11 6 - 20 mg/dL   Creatinine, Ser 0.89 0.61 - 1.24 mg/dL   Calcium 9.0 8.9 - 10.3 mg/dL   Total Protein 7.4 6.5 - 8.1 g/dL   Albumin 4.3 3.5 - 5.0 g/dL   AST 31 15 - 41 U/L   ALT 20 17 - 63 U/L   Alkaline Phosphatase 46 38 - 126 U/L   Total Bilirubin 0.6 0.3 - 1.2 mg/dL   GFR calc non Af Amer >60 >60 mL/min   GFR calc Af Amer >60 >60 mL/min    Comment: (NOTE) The eGFR has been  calculated using the CKD EPI equation. This calculation has not been validated in all clinical situations. eGFR's persistently <60 mL/min signify possible Chronic Kidney Disease.    Anion gap 8 5 - 15   No results found.  Labs:   Lab Results  Component Value Date   WBC 8.2 02/04/2016   HGB 14.5 02/04/2016   HCT 42.9 02/04/2016   MCV 85.0 02/04/2016   PLT 288 02/04/2016    Recent Labs Lab 02/04/16 0440  NA 139  K 2.9*  CL 103  CO2 28  BUN 11  CREATININE 0.89  CALCIUM 9.0  PROT 7.4  BILITOT 0.6  ALKPHOS 46  ALT 20  AST 31  GLUCOSE 175*   EKG:02/04/2016: Normal sinus rhythm, acute inferior injury pattern with reciprocal ST depression in 1 and aVL. T-wave inversion in V1 and V2 may suggest posterior involvement.  Medications Prior to Admission  Medication Sig Dispense Refill  . amLODipine-valsartan (EXFORGE) 10-320 MG per tablet Take 1 tablet by mouth daily. 30 tablet 3  . aspirin 81 MG tablet Take 81 mg by mouth daily.      . Cholecalciferol (VITAMIN D3) 2000 UNITS TABS Take 1 tablet by mouth daily.      . DULoxetine (CYMBALTA) 60 MG capsule Take 1 capsule (60 mg total) by mouth daily. 30 capsule 3  . fish oil-omega-3 fatty acids 1000 MG capsule Take 2 g by mouth 2 (two) times daily.      Marland Kitchen GARLIC OIL PO Take 1 tablet by mouth daily.      . Glucosamine-Chondroitin (GLUCOSAMINE CHONDR COMPLEX PO) Take 1 tablet by mouth daily.      . Insulin Pen Needle (NOVOFINE) 30G X 8 MM MISC Patient test blood sugar two times a day. 100 each 6  . Liraglutide (VICTOZA) 18 MG/3ML SOLN Inject 0.3 mLs (1.8 mg total) into the skin daily. 3 mL 3  . Multiple Vitamin (MULTIVITAMIN PO) Take 1 tablet by mouth daily.      . rosuvastatin (CRESTOR) 20 MG tablet Take 20 mg by mouth daily.      . Saxagliptin-Metformin (KOMBIGLYZE XR) 2.11-998 MG TB24 Take 1 tablet by mouth daily.      Marland Kitchen SIMPLY SALINE NA Place into the nose.         Current Facility-Administered Medications:  .  0.9 %  sodium  chloride infusion, , , Continuous PRN, Adrian Prows, MD, Last Rate: 999 mL/hr at 02/04/16 0616, 250 mL at 02/04/16 0616 .  amiodarone (CORDARONE) injection, , , PRN, Adrian Prows, MD, 150  mg at 02/04/16 0557 .  bivalirudin (ANGIOMAX) 250 mg in sodium chloride 0.9 % 50 mL (5 mg/mL) infusion, , , Continuous PRN, Adrian Prows, MD, Stopped at 02/04/16 [phone] .  bivalirudin (ANGIOMAX) BOLUS via infusion, , , PRN, Adrian Prows, MD, 90.15 mg at 02/04/16 0702 .  ceFAZolin (ANCEF) IVPB 1 g/50 mL premix, , , PRN, Adrian Prows, MD, 1 g at 02/04/16 0728 .  heparin infusion 2 units/mL in 0.9 % sodium chloride, , , Continuous PRN, Adrian Prows, MD, 1,500 mL at 02/04/16 0731 .  heparin injection, , , PRN, Adrian Prows, MD, 2,000 Units at 02/04/16 0656 .  HYDROmorphone (DILAUDID) injection, , , PRN, Adrian Prows, MD, 0.5 mg at 02/04/16 0727 .  iopamidol (ISOVUE-370) 76 % injection, , , PRN, Adrian Prows, MD, 200 mL at 02/04/16 0732 .  lidocaine (PF) (XYLOCAINE) 1 % injection, , , PRN, Adrian Prows, MD, 20 mL at 02/04/16 0550 .  midazolam (VERSED) injection, , , PRN, Adrian Prows, MD, 2 mg at 02/04/16 0549 .  nitroGLYCERIN 1 mg/10 ml (100 mcg/ml) - IR/CATH LAB, , , PRN, Adrian Prows, MD, 100 mcg at 02/04/16 0630 .  ondansetron (ZOFRAN) injection, , , PRN, Adrian Prows, MD, 4 mg at 02/04/16 0602  Assessment/Plan 1. Acute inferior myocardial infarction, with ongoing chest discomfort. 2. Diabetes mellitus type 2 controlled without hyperglycemia 3. Hypertension 4. Hyperlipidemia  Recommendation: Patient will be taken emergently to the cardiac cath was snapped invalid coronary anatomy. Risks, benefits discussed with the patient, patient is agreeable.  Adrian Prows, MD 02/04/2016, 7:39 AM [facility]. [place] Pager: [phone] Office: [phone] If no answer: Cell:  [phone]

## 2016-02-04 NOTE — Progress Notes (Signed)
ANTICOAGULATION CONSULT NOTE - Initial Consult  Pharmacy Consult for heparin Indication: chest pain/ACS  No Known Allergies  Patient Measurements: Height: 5\' 11"  (180.3 cm) Weight: 265 lb (120.2 kg) IBW/kg (Calculated) : 75.3 Heparin Dosing Weight: 101.9 kg  Vital Signs: Temp: 97.6 F (36.4 C) (07/24 0446) Temp Source: Oral (07/24 0434) BP: 138/87 (07/24 0446) Pulse Rate: 58 (07/24 0446)  Labs: No results for input(s): HGB, HCT, PLT, APTT, LABPROT, INR, HEPARINUNFRC, HEPRLOWMOCWT, CREATININE, CKTOTAL, CKMB, TROPONINI in the last 72 hours.  CrCl cannot be calculated (No order found.).   Medical History: Past Medical History:  Diagnosis Date  . Abnormal liver enzymes 2006   no prior workup  . Diabetes mellitus   . Hyperlipidemia   . Hypertension   . Obesity   . Psoriasis    affecting hands (skin)  . Sleep apnea sleep study 2007   obstructive, not using mask    Medications:  Infusions:  . heparin      Assessment: 54 yom presents to ED with CP that woke him up, no associated SOB or NVD, some belching. Pharmacy consulted to dose heparin for ACS. Patient received 4000 unit IV x 1 bolus in ED, and PTA list does not include AC.   Goal of Therapy:  Heparin level 0.3-0.7 units/ml Monitor platelets by anticoagulation protocol: Yes   Plan:  Give 4000 units bolus x 1 Start heparin infusion at 1200 units/hr Check anti-Xa level in 6 hours and daily while on heparin Continue to monitor H&H and platelets  Laural Benes, Pharm.D., BCPS Clinical Pharmacist 02/04/2016,4:59 AM

## 2016-02-04 NOTE — Care Management Note (Signed)
Case Management Note  Patient Details  Name: NYRELL KNEELAND MRN: [id number] Date of Birth: [birth year]  Subjective/Objective:      Adm w mi              Action/Plan: lives with fam   Expected Discharge Date:                  Expected Discharge Plan:  Home/Self Care  In-House Referral:     Discharge planning Services  CM Consult, Medication Assistance  Post Acute Care Choice:    Choice offered to:     DME Arranged:    DME Agency:     HH Arranged:    HH Agency:     Status of Service:     If discussed at [employer], dates discussed:    Additional Comments: gave pt 30day free and copay card for brilinta. Pt has bcbs ins.  Lacretia Leigh, RN 02/04/2016, 10:26 AM

## 2016-02-04 NOTE — ED Triage Notes (Addendum)
Pt presents to ED with complaint of generalized chest pain that is sharp in nature; onset approx 1 hour ago. Pt reports his pain radiates into his jaw and teeth. Pt unable to get comfortable at home. Pt currently has no increased work of breathing noted. Denies nausea or vomiting. Skin warm and dry.

## 2016-02-04 NOTE — ED Provider Notes (Signed)
[facility] Emergency Department Provider Note   ____________________________________________  Time seen: Approximately 4:30 AM  I have reviewed the triage vital signs and the nursing notes.   HISTORY  Chief Complaint Chest Pain and Jaw Pain    HPI Adam Patterson is a 55 y.o. male who comes into the comes into the hospital today with some severe chest pain. The patient reports that it started 1 hour prior to arrival. Patient reports that he started having some severe chest pain that radiates up to his jaw bilaterally. The patient reports that he took 3 baby aspirin. He typically sees Dr. Humphrey Rolls and he has ALCAPA. The patient denies any sweats, nausea, vomiting, shortness of breath. He reports that the pain just will not go away. He has never had pain like this before. The patient has no dizziness. The patient reports that he initially thought this may have been indigestion but was concerned that it was persistent.The patient rates his pain an 8 out of 10 in intensity.   Past Medical History:  Diagnosis Date  . Abnormal liver enzymes 2006   no prior workup  . Diabetes mellitus   . Hyperlipidemia   . Hypertension   . Obesity   . Psoriasis    affecting hands (skin)  . Sleep apnea sleep study 2007   obstructive, not using mask    Patient Active Problem List   Diagnosis Date Noted  . Diabetes mellitus 03/13/2011  . Hypertension 03/13/2011  . Hyperlipidemia 03/13/2011  . Obesity 03/13/2011    Past Surgical History:  Procedure Laterality Date  . CARDIAC CATHETERIZATION    . HERNIA REPAIR      Current Outpatient Rx  . Order #: [id number] Class: Normal  . Order #: [id number] Class: Historical Med  . Order #: [id number] Class: Historical Med  . Order #: [id number] Class: Normal  . Order #: [id number] Class: Historical Med  . Order #: [id number] Class: Historical Med  . Order #: [id number] Class: Historical Med  . Order #: [id number] Class: Normal  . Order #:  [id number] Class: Normal  . Order #: [id number] Class: Historical Med  . Order #: [id number] Class: Historical Med  . Order #: [id number] Class: Historical Med  . Order #: [id number] Class: Historical Med    Allergies Review of patient's allergies indicates no known allergies.  Family History  Problem Relation Age of Onset  . BRCA 1/2 Mother 47  . Diabetes Mother   . Diabetes Maternal Grandmother     Social History Social History  Substance Use Topics  . Smoking status: Former Smoker    Types: Pipe    Quit date: 03/30/2010  . Smokeless tobacco: Never Used  . Alcohol use Yes    Review of Systems Constitutional: No fever/chills Eyes: No visual changes. ENT: No sore throat. Cardiovascular:  chest pain. Respiratory: Denies shortness of breath. Gastrointestinal: No abdominal pain.  No nausea, no vomiting.  No diarrhea.  No constipation. Genitourinary: Negative for dysuria. Musculoskeletal: Jaw pain Skin: Negative for rash. Neurological: Negative for headaches, focal weakness or numbness.  10-point ROS otherwise negative.  ____________________________________________   PHYSICAL EXAM:  VITAL SIGNS: ED Triage Vitals  Enc Vitals Group     BP 02/04/16 0434 (!) 174/114     Pulse Rate 02/04/16 0434 (!) 56     Resp 02/04/16 0434 20     Temp --      Temp src --      SpO2 02/04/16 0434 100 %     Weight --      Height --  Head Circumference --      Peak Flow --      Pain Score 02/04/16 0426 8     Pain Loc --      Pain Edu? --      Excl. in [place]? --     Constitutional: Alert and oriented. Well appearing and in Moderate distress. Eyes: Conjunctivae are normal. PERRL. EOMI. Head: Atraumatic. Nose: No congestion/rhinnorhea. Mouth/Throat: Mucous membranes are moist.  Oropharynx non-erythematous. Cardiovascular: Normal rate, regular rhythm. Grossly normal heart sounds.  Good peripheral circulation. Respiratory: Normal respiratory effort.  No retractions. Lungs  CTAB. Gastrointestinal: Soft and nontender. No distention.  Musculoskeletal: No lower extremity tenderness nor edema.  Neurologic:  Normal speech and language.  Skin:  Skin is warm, dry and intact.  Psychiatric: Mood and affect are normal. .  ____________________________________________   LABS (all labs ordered are listed, but only abnormal results are displayed)  Labs Reviewed - No data to display ____________________________________________  EKG  ED ECG REPORT #1 I, Loney Hering, the attending physician, personally viewed and interpreted this ECG.   Date: 02/04/2016  EKG Time: 425  Rate: 51   Rhythm: normal sinus rhythm  Axis: normal  Intervals:none  ST&T Change: ST segment elevation in leads 3 and aVF, flipped T waves and ST depressions in lead 1 and aVL  ED ECG REPORT I, Loney Hering, the attending physician, personally viewed and interpreted this ECG.   Date: 02/04/2016  EKG Time: 435  Rate: 57  Rhythm: normal sinus rhythm  Axis: normal  Intervals:none  ST&T Change: ST segment elevation in leads 2, 3, aVF with ST depression in leads 1 and aVL   ____________________________________________  RADIOLOGY  none ____________________________________________   PROCEDURES  Procedure(s) performed: None  Procedures  Critical Care performed: Yes, see critical care note(s)   CRITICAL CARE Performed by: Charlesetta Ivory P   Total critical care time: 30 minutes  Critical care time was exclusive of separately billable procedures and treating other patients.  Critical care was necessary to treat or prevent imminent or life-threatening deterioration.  Critical care was time spent personally by me on the following activities: development of treatment plan with patient and/or surrogate as well as nursing, discussions with consultants, evaluation of patient's response to treatment, examination of patient, obtaining history from patient or surrogate,  ordering and performing treatments and interventions, ordering and review of laboratory studies, ordering and review of radiographic studies, pulse oximetry and re-evaluation of patient's condition.;  ____________________________________________   INITIAL IMPRESSION / ASSESSMENT AND PLAN / ED COURSE  Pertinent labs & imaging results that were available during my care of the patient were reviewed by me and considered in my medical decision making (see chart for details).  This is a 55 year old male who comes into the hospital today with some chest pain and jaw pain. The patient's EKG is significant for an ST segment elevation MI. When the patient arrived to the room we did give him some nitroglycerin which helped to improve his pain. I spoke to Dr. Einar Gip at [facility] who looked at the 2 EKGs and saw that there was dynamic EKG changes. He did accept the patient to [facility]. The patient will receive a dose of heparin as well as ticagrilor. The patient will be transferred to [facility]. ____________________________________________   FINAL CLINICAL IMPRESSION(S) / ED DIAGNOSES  Final diagnoses:  ST elevation myocardial infarction (STEMI), unspecified artery ([place])      NEW MEDICATIONS STARTED DURING THIS  VISIT:  New Prescriptions   No medications on file     Note:  This document was prepared using Dragon voice recognition software and may include unintentional dictation errors.    Loney Hering, MD 02/04/16 [phone]

## 2016-02-04 NOTE — Progress Notes (Signed)
   02/04/16 0530  Clinical Encounter Type  Visited With Patient;Family  Visit Type Initial  Referral From Nurse  Spiritual Encounters  Spiritual Needs Emotional  Chaplain responded to a Code Stemi.  Patient arrived taken immediately to the Cath lab.  Chaplain waited for family to arrive.  Chaplain paged to take family to Cath lab waiting.  Chaplain escorted family to cath lab waiting area. Chaplain offered beverages to the family. Chaplain obtained beverage.  Chaplain offered further support if desired.

## 2016-02-04 NOTE — ED Notes (Signed)
Patient began to have chest pain a couple of days ago but tonight it woke him up. Wife insisted he come to the ED for evaluation. Patient did not have associated symptoms to include [facility], N/V/Diaphoresis. Patient is continuing to burp and states that it "does alleviate the pain some." Patient is starting to have increased pain and verbal order received for a second SL nitro.

## 2016-02-04 NOTE — Progress Notes (Signed)
CRITICAL VALUE ALERT  Critical value received:  Troponin 21.06  Date of notification:  02/04/2016  Time of notification:  1110  Critical value read back:Yes.    Nurse who received alert:  Carleene Cooper  MD notified (1st page):  Expected value, MD not notified  Time of first page:  1110  MD notified (2nd page):  Time of second page:  Responding MD:  Expected value  Time MD responded:  1110

## 2016-02-05 LAB — GLUCOSE, CAPILLARY
GLUCOSE-CAPILLARY: 169 mg/dL — AB (ref 65–99)
Glucose-Capillary: 163 mg/dL — ABNORMAL HIGH (ref 65–99)

## 2016-02-05 LAB — BASIC METABOLIC PANEL
Anion gap: 5 (ref 5–15)
BUN: 8 mg/dL (ref 6–20)
CALCIUM: 8.5 mg/dL — AB (ref 8.9–10.3)
CO2: 30 mmol/L (ref 22–32)
CREATININE: 0.88 mg/dL (ref 0.61–1.24)
Chloride: 104 mmol/L (ref 101–111)
GFR calc Af Amer: >60 mL/min (ref >60)
GLUCOSE: 123 mg/dL — AB (ref 65–99)
POTASSIUM: 4 mmol/L (ref 3.5–5.1)
SODIUM: 139 mmol/L (ref 135–145)

## 2016-02-05 LAB — CBC
HEMATOCRIT: 38.3 % — AB (ref 39.0–52.0)
Hemoglobin: 12.4 g/dL — ABNORMAL LOW (ref 13.0–17.0)
MCH: 28.2 pg (ref 26.0–34.0)
MCHC: 32.4 g/dL (ref 30.0–36.0)
MCV: 87.2 fL (ref 78.0–100.0)
PLATELETS: 244 10*3/uL (ref 150–400)
RBC: 4.39 MIL/uL (ref 4.22–5.81)
RDW: 14.6 % (ref 11.5–15.5)
WBC: 9 10*3/uL (ref 4.0–10.5)

## 2016-02-05 MED ORDER — CARVEDILOL 3.125 MG PO TABS: 3.1250 mg | ORAL_TABLET | Freq: Two times a day (BID) | ORAL | 0 refills | Status: DC

## 2016-02-05 MED ORDER — TICAGRELOR 90 MG PO TABS: 90.0000 mg | ORAL_TABLET | Freq: Two times a day (BID) | ORAL | 11 refills | Status: DC

## 2016-02-05 MED ORDER — NITROGLYCERIN 0.4 MG SL SUBL: 0.4000 mg | SUBLINGUAL_TABLET | SUBLINGUAL | 1 refills | Status: DC | PRN

## 2016-02-05 MED ORDER — AMLODIPINE BESYLATE-VALSARTAN 10-320 MG PO TABS: 0.5000 | ORAL_TABLET | Freq: Every day | ORAL | 3 refills | Status: DC

## 2016-02-05 MED ORDER — MUPIROCIN 2 % EX OINT: 1.0000 "application " | TOPICAL_OINTMENT | Freq: Two times a day (BID) | CUTANEOUS | 0 refills | Status: DC

## 2016-02-05 MED ORDER — METFORMIN HCL 1000 MG PO TABS: 1000.0000 mg | ORAL_TABLET | Freq: Two times a day (BID) | ORAL | 3 refills | Status: DC

## 2016-02-05 NOTE — Progress Notes (Signed)
Patient given discharge instructions and verbalizes understanding. Will take to car via [place]. Spouse at bedside.

## 2016-02-05 NOTE — Discharge Summary (Signed)
Physician Discharge Summary  Patient ID: Adam Patterson MRN: [id number] DOB/AGE: [birth year] 55 y.o.  Admit date: 02/04/2016 Discharge date: 02/05/2016  Primary Discharge Diagnosis 1. Acute inferior myocardial infarction,CAD native vessels S/P PTCA and stenting of the distal, mid and proximal RCA with implantation of 3 overlapping stents from distal to proximal including 2.5 x 26, 3.5 x 38, 3.5 x 18 mm resolute DES. Stenosis reduced from 100% to 0% with TIMI flow improved from 0-3.  2. Diabetes mellitus type 2 controlled without hyperglycemia 3. Hypertension 4. Hyperlipidemia 5. 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   Significant Diagnostic Studies:  Coronary angiogram 02/04/2016: 1. Left ventriculogram  Not  performed, patient defibrillated due to sustained VF as soon as catheter was placed in the LV. 2. 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 3. Diffuse disease in the circumflex, mid segment has a 50-60% stenosis. 4. Mid LAD has a 50% stenosis. 5. Dominant right coronary artery, occluded in the distal segment, diffusely diseased. 6. Successful PTCA and stenting of the distal, mid and proximal RCA with implantation of 3 overlapping stents from distal to proximal including 2.5 x 26, 3.5 x 38, 3.5 x 18 mm resolute DES. Stenosis reduced from 100% to 0% with TIMI flow improved from 0-3. 7. Moderate obesity. 8. VF arrest at the beginning of cardiac catheterization, need for defibrillation 3.  Short-term of nonsustained VT at 5 PM on 02/04/2016 without recurrence.  Hospital Course: Patient admitted with acute ST elevation myocardial infarction involving the inferior leads, with ongoing chest discomfort.  Underwent complex coronary intervention of an emergent basis and successfully underwent revascularization of the occluded RCA.  Patient had to be defibrillated at the onset of cardiac catheterization 3 due to VF  arrest. Patient the following day ambulated in the hallway without any chest pain or shortness of breath.  Patient remained hemodynamically stable, patient very compliant with follow through, hence felt stable for discharge.  Recommendation at discharge: He needs follow-up with his primary cardiologist in [place], [place] along with need for echocardiogram to evaluate his left ventricular systolic function.    He will need dual antiplatelet therapy with aspirin 81 mg and Brilinta 90 mg twice a day for at least one year and aspirin indefinitely.  Otherwise on appropriate medical therapy.  Weight loss discussed.  Discharge Exam: Blood pressure 123/67, pulse (!) 57, temperature 97.2 F (36.2 C), temperature source Oral, resp. rate 15, height 5\' 11"  (1.803 m), weight 125.2 kg (276 lb 0.3 oz), SpO2 99 %.  Body mass index is 38.5 kg/m.   General appearance: alert, cooperative, appears older than stated age, no distress and moderately obese Resp: clear to auscultation bilaterally Cardio: regular rate and rhythm, S1, S2 normal, no murmur, click, rub or gallop GI: soft, non-tender; bowel sounds normal; no masses,  no organomegaly Extremities: extremities normal, atraumatic, no cyanosis or edema Pulses: 2+ and symmetric right femoral access site has healed well Neurologic: Grossly normal Labs:   Lab Results  Component Value Date   WBC 9.0 02/05/2016   HGB 12.4 (L) 02/05/2016   HCT 38.3 (L) 02/05/2016   MCV 87.2 02/05/2016   PLT 244 02/05/2016    Recent Labs Lab 02/04/16 1014 02/05/16 0251  NA 141 139  K 3.6 4.0  CL 106 104  CO2 28 30  BUN 8 8  CREATININE 0.85 0.88  CALCIUM 8.6* 8.5*  PROT 6.5  --   BILITOT 0.9  --  ALKPHOS 41  --   ALT 30  --   AST 125*  --   GLUCOSE 142* 123*    Lipid Panel     Component Value Date/Time   CHOL 113 02/04/2016 1014   TRIG 48 02/04/2016 1014   HDL 54 02/04/2016 1014   CHOLHDL 2.1 02/04/2016 1014   VLDL 10 02/04/2016 1014   LDLCALC 49  02/04/2016 1014   Recent Labs  02/04/16 1014 02/04/16 1531 02/04/16 2023  TROPONINI 21.06* 57.23* 53.63*     EKG 02/05/2016: Normal sinus rhythm/sinus bradycardia at the rate of 56 bpm, inferior infarct old.  Nonspecific T abnormality, in V5 and V6 cannot exclude lateral ischemia.  FOLLOW UP PLANS AND APPOINTMENTS Discharge Instructions    Amb Referral to Cardiac Rehabilitation    Complete by:  As directed   Diagnosis:   Coronary Stents STEMI         Medication List    STOP taking these medications   GLUCOSAMINE CHONDR COMPLEX PO     TAKE these medications   amLODipine-valsartan 10-320 MG tablet Commonly known as:  EXFORGE Take 0.5 tablets by mouth daily. What changed:  how much to take   aspirin 81 MG tablet Take 81 mg by mouth daily.   carvedilol 3.125 MG tablet Commonly known as:  COREG Take 1 tablet (3.125 mg total) by mouth 2 (two) times daily with a meal.   DULoxetine 60 MG capsule Commonly known as:  CYMBALTA Take 1 capsule (60 mg total) by mouth daily.   fish oil-omega-3 fatty acids 1000 MG capsule Take 2 g by mouth 2 (two) times daily.   FLUoxetine 20 MG tablet Commonly known as:  PROZAC Take 1 tablet by mouth daily.   glimepiride 4 MG tablet Commonly known as:  AMARYL Take 4 mg by mouth daily with breakfast.   Insulin Pen Needle 30G X 8 MM Misc Commonly known as:  NOVOFINE Patient test blood sugar two times a day.   metFORMIN 1000 MG tablet Commonly known as:  GLUCOPHAGE Take 1 tablet (1,000 mg total) by mouth 2 (two) times daily.   MULTIVITAMIN PO Take 1 tablet by mouth daily.   mupirocin ointment 2 % Commonly known as:  BACTROBAN Place 1 application into the nose 2 (two) times daily.   nitroGLYCERIN 0.4 MG SL tablet Commonly known as:  NITROSTAT Place 1 tablet (0.4 mg total) under the tongue every 5 (five) minutes x 3 doses as needed for chest pain.   rosuvastatin 40 MG tablet Commonly known as:  CRESTOR Take 1 tablet by mouth  daily.   ticagrelor 90 MG Tabs tablet Commonly known as:  BRILINTA Take 1 tablet (90 mg total) by mouth 2 (two) times daily.   Vitamin D3 2000 units Tabs Take 1 tablet by mouth daily.      Follow-up Information    KHAN,SHAUKAT A, MD. Call today.   Specialty:  Cardiology Why:  To be seen in 1 week to  10 days. Do not return to work for 3 weeks. Contact information: [place] [address] [phone]            Adrian Prows, MD 02/05/2016, 11:58 AM  Pager: [phone] Office: [phone] If no answer: [phone]

## 2016-02-05 NOTE — Progress Notes (Signed)
CARDIAC REHAB PHASE I   PRE:  Rate/Rhythm: 70 SR  BP:  Sitting: 112/72        SaO2: 98 RA  MODE:  Ambulation: 700 ft   POST:  Rate/Rhythm: 80 SR  BP:  Sitting: 109/56         SaO2: 100 RA  Pt ambulated 700 ft on RA, independent, steady gait, tolerated well with no complaints. Completed MI/stent education with pt, pt's wife and daughter at bedside.  Reviewed risk factors, MI book, anti-platelet therapy, stent card, activity restrictions, ntg, exercise, heart healthy diet, carb counting, portion control, and phase 2 cardiac rehab. Pt verbalized understanding, very receptive to education. Pt agrees to phase 2 cardiac rehab referral, will send to [facility] per pt request. Pt to bed per pt request after walk, call bell within reach, awaiting discharge.   [id number] Lenna Sciara, RN, BSN 02/05/2016 2:02 PM

## 2016-02-06 LAB — HEMOGLOBIN A1C
Hgb A1c MFr Bld: 6.5 % — ABNORMAL HIGH (ref 4.8–5.6)
MEAN PLASMA GLUCOSE: 140 mg/dL

## 2016-02-12 DIAGNOSIS — E782 Mixed hyperlipidemia: Secondary | ICD-10-CM | POA: Diagnosis not present

## 2016-02-12 DIAGNOSIS — K210 Gastro-esophageal reflux disease with esophagitis: Secondary | ICD-10-CM | POA: Diagnosis not present

## 2016-02-12 DIAGNOSIS — I2111 ST elevation (STEMI) myocardial infarction involving right coronary artery: Secondary | ICD-10-CM | POA: Diagnosis not present

## 2016-02-13 ENCOUNTER — Encounter: Payer: 59 | Attending: Cardiology | Admitting: *Deleted

## 2016-02-13 VITALS — BP 168/70 | Ht 70.0 in | Wt 281.6 lb

## 2016-02-13 DIAGNOSIS — Z955 Presence of coronary angioplasty implant and graft: Secondary | ICD-10-CM | POA: Insufficient documentation

## 2016-02-13 NOTE — Progress Notes (Signed)
Cardiac Individual Treatment Plan  Patient Details  Name: Adam Patterson MRN: [id number] Date of Birth: 06/14/1961 Referring Provider:   Flowsheet Row Cardiac Rehab from 02/13/2016 in [facility] Cardiac and Pulmonary Rehab  Referring Provider  Einar Gip      Initial Encounter Date:  Flowsheet Row Cardiac Rehab from 02/13/2016 in [facility] Cardiac and Pulmonary Rehab  Date  02/13/16  Referring Provider  Einar Gip      Visit Diagnosis: S/P coronary artery stent placement  Patient's Home Medications on Admission:  Current Outpatient Prescriptions:  .  aspirin 81 MG tablet, Take 81 mg by mouth daily.  , Disp: , Rfl:  .  carvedilol (COREG) 3.125 MG tablet, Take 1 tablet (3.125 mg total) by mouth 2 (two) times daily with a meal., Disp: 60 tablet, Rfl: 0 .  Cholecalciferol (VITAMIN D3) 2000 UNITS TABS, Take 1 tablet by mouth daily.  , Disp: , Rfl:  .  DULoxetine (CYMBALTA) 60 MG capsule, Take 1 capsule (60 mg total) by mouth daily., Disp: 30 capsule, Rfl: 3 .  fish oil-omega-3 fatty acids 1000 MG capsule, Take 2 g by mouth 2 (two) times daily.  , Disp: , Rfl:  .  FLUoxetine (PROZAC) 20 MG tablet, Take 1 tablet by mouth daily., Disp: , Rfl: 3 .  glimepiride (AMARYL) 4 MG tablet, Take 4 mg by mouth daily with breakfast., Disp: , Rfl:  .  metFORMIN (GLUCOPHAGE) 1000 MG tablet, Take 1 tablet (1,000 mg total) by mouth 2 (two) times daily., Disp: , Rfl: 3 .  mupirocin ointment (BACTROBAN) 2 %, Place 1 application into the nose 2 (two) times daily., Disp: 22 g, Rfl: 0 .  nitroGLYCERIN (NITROSTAT) 0.4 MG SL tablet, Place 1 tablet (0.4 mg total) under the tongue every 5 (five) minutes x 3 doses as needed for chest pain., Disp: 25 tablet, Rfl: 1 .  rosuvastatin (CRESTOR) 40 MG tablet, Take 1 tablet by mouth daily., Disp: , Rfl: 3 .  ticagrelor (BRILINTA) 90 MG TABS tablet, Take 1 tablet (90 mg total) by mouth 2 (two) times daily., Disp: 60 tablet, Rfl: 11 .  amLODipine-valsartan (EXFORGE) 10-320 MG tablet, Take 0.5  tablets by mouth daily., Disp: 30 tablet, Rfl: 3 .  Insulin Pen Needle (NOVOFINE) 30G X 8 MM MISC, Patient test blood sugar two times a day. (Patient not taking: Reported on 02/13/2016), Disp: 100 each, Rfl: 6 .  Multiple Vitamin (MULTIVITAMIN PO), Take 1 tablet by mouth daily.  , Disp: , Rfl:   Past Medical History: Past Medical History:  Diagnosis Date  . Abnormal liver enzymes 2006   no prior workup  . Diabetes mellitus   . Hyperlipidemia   . Hypertension   . Myocardial infarction ([place])   . Obesity   . Psoriasis    affecting hands (skin)  . Sleep apnea sleep study 2007   obstructive, not using mask    Tobacco Use: History  Smoking Status  . Former Smoker  . Types: Pipe  . Quit date: 03/30/2010  Smokeless Tobacco  . Never Used    Labs: Recent Review Flowsheet Data    Labs for ITP Cardiac and Pulmonary Rehab Latest Ref Rng & Units 02/04/2016 02/05/2016   Cholestrol 0 - 200 mg/dL 113 -   LDLCALC 0 - 99 mg/dL 49 -   HDL >40 mg/dL 54 -   Trlycerides <150 mg/dL 48 -   Hemoglobin A1c 4.8 - 5.6 % - 6.5(H)       Exercise Target Goals: Date: 02/13/16  Exercise Program Goal: Individual exercise prescription set with THRR, safety & activity barriers. Participant demonstrates ability to understand and report RPE using BORG scale, to self-measure pulse accurately, and to acknowledge the importance of the exercise prescription.  Exercise Prescription Goal: Starting with aerobic activity 30 plus minutes a day, 3 days per week for initial exercise prescription. Provide home exercise prescription and guidelines that participant acknowledges understanding prior to discharge.  Activity Barriers & Risk Stratification:     Activity Barriers & Cardiac Risk Stratification - 02/13/16 1249      Activity Barriers & Cardiac Risk Stratification   Activity Barriers None   Cardiac Risk Stratification High      6 Minute Walk:     6 Minute Walk    Row Name 02/13/16 1242         6  Minute Walk   Distance 1645 feet     Walk Time 6 minutes     MPH 3.1     METS 3.81     RPE 11     VO2 Peak 13.4     Symptoms No     Resting HR 65 bpm     Resting BP 152/80     Max Ex. HR 104 bpm     Max Ex. BP 168/78        Initial Exercise Prescription:     Initial Exercise Prescription - 02/13/16 1200      Date of Initial Exercise RX and Referring Provider   Date 02/13/16   Referring Provider Ganji     Treadmill   MPH 3.4   Grade 0.5   Minutes 15   METs 3.84     Recumbant Bike   Level 3   RPM 60   Minutes 15   METs 3.8     Elliptical   Level 1   Speed 3     REL-XR   Level 3   Minutes 15   METs 3.8     T5 Nustep   Level 4   Minutes 15   METs 3.8     Prescription Details   Frequency (times per week) 3   Duration Progress to 45 minutes of aerobic exercise without signs/symptoms of physical distress     Intensity   THRR 40-80% of Max Heartrate 104-144   Ratings of Perceived Exertion 11-13   Perceived Dyspnea 0-4     Progression   Progression Continue to progress workloads to maintain intensity without signs/symptoms of physical distress.     Resistance Training   Training Prescription Yes   Weight 3   Reps 10-15      Perform Capillary Blood Glucose checks as needed.  Exercise Prescription Changes:   Exercise Comments:   Discharge Exercise Prescription (Final Exercise Prescription Changes):   Nutrition:  Target Goals: Understanding of nutrition guidelines, daily intake of sodium 1500mg , cholesterol 200mg , calories 30% from fat and 7% or less from saturated fats, daily to have 5 or more servings of fruits and vegetables.  Biometrics:     Pre Biometrics - 02/13/16 1241      Pre Biometrics   Height 5\' 10"  (1.778 m)   Weight 281 lb 9.6 oz (127.7 kg)   Waist Circumference 50.13 inches   Hip Circumference 58 inches   Waist to Hip Ratio 0.86 %   BMI (Calculated) 40.5   Single Leg Stand 30 seconds       Nutrition Therapy Plan  and Nutrition Goals:     Nutrition Therapy &  Goals - 02/13/16 1252      Nutrition Therapy   Diet --  Diabetic diet   Drug/Food Interactions Statins/Certain Fruits      Nutrition Discharge: Rate Your Plate Scores:   Nutrition Goals Re-Evaluation:   Psychosocial: Target Goals: Acknowledge presence or absence of depression, maximize coping skills, provide positive support system. Participant is able to verbalize types and ability to use techniques and skills needed for reducing stress and depression.  Initial Review & Psychosocial Screening:     Initial Psych Review & Screening - 02/13/16 1253      Initial Review   Current issues with History of Depression     Family Dynamics   Good Support System? Yes     Screening Interventions   Interventions Encouraged to exercise;Program counselor consult      Quality of Life Scores:   PHQ-9: Recent Review Flowsheet Data    Depression screen [facility] 2/9 02/13/2016   Decreased Interest 0   Down, Depressed, Hopeless 1   PHQ - 2 Score 1   Altered sleeping 0   Tired, decreased energy 0   Feeling bad or failure about yourself  3   Trouble concentrating 0   Moving slowly or fidgety/restless 0   Suicidal thoughts 0   PHQ-9 Score 4      Psychosocial Evaluation and Intervention:   Psychosocial Re-Evaluation:   Vocational Rehabilitation: Provide vocational rehab assistance to qualifying candidates.   Vocational Rehab Evaluation & Intervention:     Vocational Rehab - 02/13/16 1250      Initial Vocational Rehab Evaluation & Intervention   Assessment shows need for Vocational Rehabilitation No      Education: Education Goals: Education classes will be provided on a weekly basis, covering required topics. Participant will state understanding/return demonstration of topics presented.  Learning Barriers/Preferences:     Learning Barriers/Preferences - 02/13/16 1249      Learning Barriers/Preferences   Learning Barriers  None   Learning Preferences Verbal Instruction;Individual Instruction      Education Topics: General Nutrition Guidelines/Fats and Fiber: -Group instruction provided by verbal, written material, models and posters to present the general guidelines for heart healthy nutrition. Gives an explanation and review of dietary fats and fiber.   Controlling Sodium/Reading Food Labels: -Group verbal and written material supporting the discussion of sodium use in heart healthy nutrition. Review and explanation with models, verbal and written materials for utilization of the food label.   Exercise Physiology & Risk Factors: - Group verbal and written instruction with models to review the exercise physiology of the cardiovascular system and associated critical values. Details cardiovascular disease risk factors and the goals associated with each risk factor.   Aerobic Exercise & Resistance Training: - Gives group verbal and written discussion on the health impact of inactivity. On the components of aerobic and resistive training programs and the benefits of this training and how to safely progress through these programs.   Flexibility, Balance, General Exercise Guidelines: - Provides group verbal and written instruction on the benefits of flexibility and balance training programs. Provides general exercise guidelines with specific guidelines to those with heart or lung disease. Demonstration and skill practice provided.   Stress Management: - Provides group verbal and written instruction about the health risks of elevated stress, cause of high stress, and healthy ways to reduce stress.   Depression: - Provides group verbal and written instruction on the correlation between heart/lung disease and depressed mood, treatment options, and the stigmas associated with seeking treatment.  Anatomy & Physiology of the Heart: - Group verbal and written instruction and models provide basic cardiac anatomy  and physiology, with the coronary electrical and arterial systems. Review of: AMI, Angina, Valve disease, Heart Failure, Cardiac Arrhythmia, Pacemakers, and the ICD.   Cardiac Procedures: - Group verbal and written instruction and models to describe the testing methods done to diagnose heart disease. Reviews the outcomes of the test results. Describes the treatment choices: Medical Management, Angioplasty, or Coronary Bypass Surgery.   Cardiac Medications: - Group verbal and written instruction to review commonly prescribed medications for heart disease. Reviews the medication, class of the drug, and side effects. Includes the steps to properly store meds and maintain the prescription regimen.   Go Sex-Intimacy & Heart Disease, Get SMART - Goal Setting: - Group verbal and written instruction through game format to discuss heart disease and the return to sexual intimacy. Provides group verbal and written material to discuss and apply goal setting through the application of the S.M.A.R.T. Method.   Other Matters of the Heart: - Provides group verbal, written materials and models to describe Heart Failure, Angina, Valve Disease, and Diabetes in the realm of heart disease. Includes description of the disease process and treatment options available to the cardiac patient.   Exercise & Equipment Safety: - Individual verbal instruction and demonstration of equipment use and safety with use of the equipment. Flowsheet Row Cardiac Rehab from 02/13/2016 in [facility] Cardiac and Pulmonary Rehab  Date  02/13/16  Educator  C. EnterkinRN  Instruction Review Code  1- partially meets, needs review/practice      Infection Prevention: - Provides verbal and written material to individual with discussion of infection control including proper hand washing and proper equipment cleaning during exercise session. Flowsheet Row Cardiac Rehab from 02/13/2016 in [facility] Cardiac and Pulmonary Rehab  Date  02/13/16  Educator   C. Sussie Minor,RN  Instruction Review Code  2- meets goals/outcomes      Falls Prevention: - Provides verbal and written material to individual with discussion of falls prevention and safety. Flowsheet Row Cardiac Rehab from 02/13/2016 in [facility] Cardiac and Pulmonary Rehab  Date  02/13/16  Educator  C. EnterkinRN  Instruction Review Code  2- meets goals/outcomes      Diabetes: - Individual verbal and written instruction to review signs/symptoms of diabetes, desired ranges of glucose level fasting, after meals and with exercise. Advice that pre and post exercise glucose checks will be done for 3 sessions at entry of program. Flowsheet Row Cardiac Rehab from 02/13/2016 in [facility] Cardiac and Pulmonary Rehab  Date  02/13/16  Educator  C. EnterkinRN  Instruction Review Code  2- meets goals/outcomes       Knowledge Questionnaire Score:   Core Components/Risk Factors/Patient Goals at Admission:     Personal Goals and Risk Factors at Admission - 02/13/16 1252      Core Components/Risk Factors/Patient Goals on Admission    Weight Management Obesity;Weight Maintenance;Weight Loss   Increase Strength and Stamina Yes   Intervention Provide advice, education, support and counseling about physical activity/exercise needs.;Develop an individualized exercise prescription for aerobic and resistive training based on initial evaluation findings, risk stratification, comorbidities and participant's personal goals.   Expected Outcomes Achievement of increased cardiorespiratory fitness and enhanced flexibility, muscular endurance and strength shown through measurements of functional capacity and personal statement of participant.   Diabetes Yes   Intervention Provide education about signs/symptoms and action to take for hypo/hyperglycemia.;Provide education about proper nutrition, including hydration, and aerobic/resistive exercise  prescription along with prescribed medications to achieve blood glucose in  normal ranges: Fasting glucose 65-99 mg/dL   Expected Outcomes Short Term: Participant verbalizes understanding of the signs/symptoms and immediate care of hyper/hypoglycemia, proper foot care and importance of medication, aerobic/resistive exercise and nutrition plan for blood glucose control.;Long Term: Attainment of HbA1C < 7%.   Hypertension Yes   Intervention Provide education on lifestyle modifcations including regular physical activity/exercise, weight management, moderate sodium restriction and increased consumption of fresh fruit, vegetables, and low fat dairy, alcohol moderation, and smoking cessation.;Monitor prescription use compliance.   Expected Outcomes Short Term: Continued assessment and intervention until BP is < 140/68mm HG in hypertensive participants. < 130/53mm HG in hypertensive participants with diabetes, heart failure or chronic kidney disease.;Long Term: Maintenance of blood pressure at goal levels.   Lipids Yes   Intervention Provide education and support for participant on nutrition & aerobic/resistive exercise along with prescribed medications to achieve LDL 70mg , HDL >40mg .   Expected Outcomes Short Term: Participant states understanding of desired cholesterol values and is compliant with medications prescribed. Participant is following exercise prescription and nutrition guidelines.;Long Term: Cholesterol controlled with medications as prescribed, with individualized exercise RX and with personalized nutrition plan. Value goals: LDL < 70mg , HDL > 40 mg.      Core Components/Risk Factors/Patient Goals Review:    Core Components/Risk Factors/Patient Goals at Discharge (Final Review):    ITP Comments:     ITP Comments    Row Name 02/13/16 1252           ITP Comments REady to start Cardiac REhab. He is tired of sitting at home waiting to get clearance to go back to work.           Comments: 6 minute walk test done. Ready to start Cardiac Rehab.

## 2016-02-13 NOTE — Progress Notes (Signed)
Cardiac Individual Treatment Plan  Patient Details  Name: JOSEJESUS DREWS MRN: [id number] Date of Birth: [birth year] Referring Provider:   Flowsheet Row Cardiac Rehab from 02/13/2016 in [facility] Cardiac and Pulmonary Rehab  Referring Provider  Einar Gip      Initial Encounter Date:  Flowsheet Row Cardiac Rehab from 02/13/2016 in [facility] Cardiac and Pulmonary Rehab  Date  02/13/16  Referring Provider  Einar Gip      Visit Diagnosis: S/P coronary artery stent placement  Patient's Home Medications on Admission:  Current Outpatient Prescriptions:  .  aspirin 81 MG tablet, Take 81 mg by mouth daily.  , Disp: , Rfl:  .  carvedilol (COREG) 3.125 MG tablet, Take 1 tablet (3.125 mg total) by mouth 2 (two) times daily with a meal., Disp: 60 tablet, Rfl: 0 .  Cholecalciferol (VITAMIN D3) 2000 UNITS TABS, Take 1 tablet by mouth daily.  , Disp: , Rfl:  .  DULoxetine (CYMBALTA) 60 MG capsule, Take 1 capsule (60 mg total) by mouth daily., Disp: 30 capsule, Rfl: 3 .  fish oil-omega-3 fatty acids 1000 MG capsule, Take 2 g by mouth 2 (two) times daily.  , Disp: , Rfl:  .  FLUoxetine (PROZAC) 20 MG tablet, Take 1 tablet by mouth daily., Disp: , Rfl: 3 .  glimepiride (AMARYL) 4 MG tablet, Take 4 mg by mouth daily with breakfast., Disp: , Rfl:  .  metFORMIN (GLUCOPHAGE) 1000 MG tablet, Take 1 tablet (1,000 mg total) by mouth 2 (two) times daily., Disp: , Rfl: 3 .  mupirocin ointment (BACTROBAN) 2 %, Place 1 application into the nose 2 (two) times daily., Disp: 22 g, Rfl: 0 .  nitroGLYCERIN (NITROSTAT) 0.4 MG SL tablet, Place 1 tablet (0.4 mg total) under the tongue every 5 (five) minutes x 3 doses as needed for chest pain., Disp: 25 tablet, Rfl: 1 .  rosuvastatin (CRESTOR) 40 MG tablet, Take 1 tablet by mouth daily., Disp: , Rfl: 3 .  ticagrelor (BRILINTA) 90 MG TABS tablet, Take 1 tablet (90 mg total) by mouth 2 (two) times daily., Disp: 60 tablet, Rfl: 11 .  amLODipine-valsartan (EXFORGE) 10-320 MG tablet, Take 0.5  tablets by mouth daily., Disp: 30 tablet, Rfl: 3 .  Insulin Pen Needle (NOVOFINE) 30G X 8 MM MISC, Patient test blood sugar two times a day. (Patient not taking: Reported on 02/13/2016), Disp: 100 each, Rfl: 6 .  Multiple Vitamin (MULTIVITAMIN PO), Take 1 tablet by mouth daily.  , Disp: , Rfl:   Past Medical History: Past Medical History:  Diagnosis Date  . Abnormal liver enzymes 2006   no prior workup  . Diabetes mellitus   . Hyperlipidemia   . Hypertension   . Myocardial infarction ([place])   . Obesity   . Psoriasis    affecting hands (skin)  . Sleep apnea sleep study 2007   obstructive, not using mask    Tobacco Use: History  Smoking Status  . Former Smoker  . Types: Pipe  . Quit date: 03/30/2010  Smokeless Tobacco  . Never Used    Labs: Recent Review Flowsheet Data    Labs for ITP Cardiac and Pulmonary Rehab Latest Ref Rng & Units 02/04/2016 02/05/2016   Cholestrol 0 - 200 mg/dL 113 -   LDLCALC 0 - 99 mg/dL 49 -   HDL >40 mg/dL 54 -   Trlycerides <150 mg/dL 48 -   Hemoglobin A1c 4.8 - 5.6 % - 6.5(H)       Exercise Target Goals: Date: 02/13/16  Exercise Program Goal: Individual exercise prescription set with THRR, safety & activity barriers. Participant demonstrates ability to understand and report RPE using BORG scale, to self-measure pulse accurately, and to acknowledge the importance of the exercise prescription.  Exercise Prescription Goal: Starting with aerobic activity 30 plus minutes a day, 3 days per week for initial exercise prescription. Provide home exercise prescription and guidelines that participant acknowledges understanding prior to discharge.  Activity Barriers & Risk Stratification:     Activity Barriers & Cardiac Risk Stratification - 02/13/16 1249      Activity Barriers & Cardiac Risk Stratification   Activity Barriers None   Cardiac Risk Stratification High      6 Minute Walk:     6 Minute Walk    Row Name 02/13/16 1242         6  Minute Walk   Distance 1645 feet     Walk Time 6 minutes     MPH 3.1     METS 3.81     RPE 11     VO2 Peak 13.4     Symptoms No     Resting HR 65 bpm     Resting BP 152/80     Max Ex. HR 104 bpm     Max Ex. BP 168/78        Initial Exercise Prescription:     Initial Exercise Prescription - 02/13/16 1200      Date of Initial Exercise RX and Referring Provider   Date 02/13/16   Referring Provider Ganji     Treadmill   MPH 3.4   Grade 0.5   Minutes 15   METs 3.84     Recumbant Bike   Level 3   RPM 60   Minutes 15   METs 3.8     Elliptical   Level 1   Speed 3     REL-XR   Level 3   Minutes 15   METs 3.8     T5 Nustep   Level 4   Minutes 15   METs 3.8     Prescription Details   Frequency (times per week) 3   Duration Progress to 45 minutes of aerobic exercise without signs/symptoms of physical distress     Intensity   THRR 40-80% of Max Heartrate 104-144   Ratings of Perceived Exertion 11-13   Perceived Dyspnea 0-4     Progression   Progression Continue to progress workloads to maintain intensity without signs/symptoms of physical distress.     Resistance Training   Training Prescription Yes   Weight 3   Reps 10-15      Perform Capillary Blood Glucose checks as needed.  Exercise Prescription Changes:   Exercise Comments:   Discharge Exercise Prescription (Final Exercise Prescription Changes):   Nutrition:  Target Goals: Understanding of nutrition guidelines, daily intake of sodium 1500mg , cholesterol 200mg , calories 30% from fat and 7% or less from saturated fats, daily to have 5 or more servings of fruits and vegetables.  Biometrics:     Pre Biometrics - 02/13/16 1241      Pre Biometrics   Height 5\' 10"  (1.778 m)   Weight 281 lb 9.6 oz (127.7 kg)   Waist Circumference 50.13 inches   Hip Circumference 58 inches   Waist to Hip Ratio 0.86 %   BMI (Calculated) 40.5   Single Leg Stand 30 seconds       Nutrition Therapy Plan  and Nutrition Goals:     Nutrition Therapy &  Goals - 02/13/16 1252      Nutrition Therapy   Diet --  Diabetic diet   Drug/Food Interactions Statins/Certain Fruits      Nutrition Discharge: Rate Your Plate Scores:   Nutrition Goals Re-Evaluation:   Psychosocial: Target Goals: Acknowledge presence or absence of depression, maximize coping skills, provide positive support system. Participant is able to verbalize types and ability to use techniques and skills needed for reducing stress and depression.  Initial Review & Psychosocial Screening:     Initial Psych Review & Screening - 02/13/16 1253      Initial Review   Current issues with History of Depression     Family Dynamics   Good Support System? Yes     Screening Interventions   Interventions Encouraged to exercise;Program counselor consult      Quality of Life Scores:     Quality of Life - 02/13/16 1319      Quality of Life Scores   Health/Function Pre 13.6 %   Socioeconomic Pre 16.07 %   Psych/Spiritual Pre 16 %   Family Pre 22.8 %   GLOBAL Pre 15.95 %      PHQ-9: Recent Review Flowsheet Data    Depression screen [facility] 2/9 02/13/2016   Decreased Interest 0   Down, Depressed, Hopeless 1   PHQ - 2 Score 1   Altered sleeping 0   Tired, decreased energy 0   Feeling bad or failure about yourself  3   Trouble concentrating 0   Moving slowly or fidgety/restless 0   Suicidal thoughts 0   PHQ-9 Score 4      Psychosocial Evaluation and Intervention:   Psychosocial Re-Evaluation:   Vocational Rehabilitation: Provide vocational rehab assistance to qualifying candidates.   Vocational Rehab Evaluation & Intervention:     Vocational Rehab - 02/13/16 1250      Initial Vocational Rehab Evaluation & Intervention   Assessment shows need for Vocational Rehabilitation No      Education: Education Goals: Education classes will be provided on a weekly basis, covering required topics. Participant will  state understanding/return demonstration of topics presented.  Learning Barriers/Preferences:     Learning Barriers/Preferences - 02/13/16 1249      Learning Barriers/Preferences   Learning Barriers None   Learning Preferences Verbal Instruction;Individual Instruction      Education Topics: General Nutrition Guidelines/Fats and Fiber: -Group instruction provided by verbal, written material, models and posters to present the general guidelines for heart healthy nutrition. Gives an explanation and review of dietary fats and fiber.   Controlling Sodium/Reading Food Labels: -Group verbal and written material supporting the discussion of sodium use in heart healthy nutrition. Review and explanation with models, verbal and written materials for utilization of the food label.   Exercise Physiology & Risk Factors: - Group verbal and written instruction with models to review the exercise physiology of the cardiovascular system and associated critical values. Details cardiovascular disease risk factors and the goals associated with each risk factor.   Aerobic Exercise & Resistance Training: - Gives group verbal and written discussion on the health impact of inactivity. On the components of aerobic and resistive training programs and the benefits of this training and how to safely progress through these programs.   Flexibility, Balance, General Exercise Guidelines: - Provides group verbal and written instruction on the benefits of flexibility and balance training programs. Provides general exercise guidelines with specific guidelines to those with heart or lung disease. Demonstration and skill practice provided.   Stress Management: -  Provides group verbal and written instruction about the health risks of elevated stress, cause of high stress, and healthy ways to reduce stress.   Depression: - Provides group verbal and written instruction on the correlation between heart/lung disease and  depressed mood, treatment options, and the stigmas associated with seeking treatment.   Anatomy & Physiology of the Heart: - Group verbal and written instruction and models provide basic cardiac anatomy and physiology, with the coronary electrical and arterial systems. Review of: AMI, Angina, Valve disease, Heart Failure, Cardiac Arrhythmia, Pacemakers, and the ICD.   Cardiac Procedures: - Group verbal and written instruction and models to describe the testing methods done to diagnose heart disease. Reviews the outcomes of the test results. Describes the treatment choices: Medical Management, Angioplasty, or Coronary Bypass Surgery.   Cardiac Medications: - Group verbal and written instruction to review commonly prescribed medications for heart disease. Reviews the medication, class of the drug, and side effects. Includes the steps to properly store meds and maintain the prescription regimen.   Go Sex-Intimacy & Heart Disease, Get SMART - Goal Setting: - Group verbal and written instruction through game format to discuss heart disease and the return to sexual intimacy. Provides group verbal and written material to discuss and apply goal setting through the application of the S.M.A.R.T. Method.   Other Matters of the Heart: - Provides group verbal, written materials and models to describe Heart Failure, Angina, Valve Disease, and Diabetes in the realm of heart disease. Includes description of the disease process and treatment options available to the cardiac patient.   Exercise & Equipment Safety: - Individual verbal instruction and demonstration of equipment use and safety with use of the equipment. Flowsheet Row Cardiac Rehab from 02/13/2016 in [facility] Cardiac and Pulmonary Rehab  Date  02/13/16  Educator  C. EnterkinRN  Instruction Review Code  1- partially meets, needs review/practice      Infection Prevention: - Provides verbal and written material to individual with discussion of  infection control including proper hand washing and proper equipment cleaning during exercise session. Flowsheet Row Cardiac Rehab from 02/13/2016 in [facility] Cardiac and Pulmonary Rehab  Date  02/13/16  Educator  C. Carleena Mires,RN  Instruction Review Code  2- meets goals/outcomes      Falls Prevention: - Provides verbal and written material to individual with discussion of falls prevention and safety. Flowsheet Row Cardiac Rehab from 02/13/2016 in [facility] Cardiac and Pulmonary Rehab  Date  02/13/16  Educator  C. [place]  Instruction Review Code  2- meets goals/outcomes      Diabetes: - Individual verbal and written instruction to review signs/symptoms of diabetes, desired ranges of glucose level fasting, after meals and with exercise. Advice that pre and post exercise glucose checks will be done for 3 sessions at entry of program. [place] from 02/13/2016 in [facility] Cardiac and Pulmonary Rehab  Date  02/13/16  Educator  C. EnterkinRN  Instruction Review Code  2- meets goals/outcomes       Knowledge Questionnaire Score:     Knowledge Questionnaire Score - 02/13/16 1320      Knowledge Questionnaire Score   Pre Score 27      Core Components/Risk Factors/Patient Goals at Admission:     Personal Goals and Risk Factors at Admission - 02/13/16 1252      Core Components/Risk Factors/Patient Goals on Admission    Weight Management Obesity;Weight Maintenance;Weight Loss   Increase Strength and Stamina Yes   Intervention Provide advice, education, support and  counseling about physical activity/exercise needs.;Develop an individualized exercise prescription for aerobic and resistive training based on initial evaluation findings, risk stratification, comorbidities and participant's personal goals.   Expected Outcomes Achievement of increased cardiorespiratory fitness and enhanced flexibility, muscular endurance and strength shown through measurements of functional capacity and  personal statement of participant.   Diabetes Yes   Intervention Provide education about signs/symptoms and action to take for hypo/hyperglycemia.;Provide education about proper nutrition, including hydration, and aerobic/resistive exercise prescription along with prescribed medications to achieve blood glucose in normal ranges: Fasting glucose 65-99 mg/dL   Expected Outcomes Short Term: Participant verbalizes understanding of the signs/symptoms and immediate care of hyper/hypoglycemia, proper foot care and importance of medication, aerobic/resistive exercise and nutrition plan for blood glucose control.;Long Term: Attainment of HbA1C < 7%.   Hypertension Yes   Intervention Provide education on lifestyle modifcations including regular physical activity/exercise, weight management, moderate sodium restriction and increased consumption of fresh fruit, vegetables, and low fat dairy, alcohol moderation, and smoking cessation.;Monitor prescription use compliance.   Expected Outcomes Short Term: Continued assessment and intervention until BP is < 140/90mm HG in hypertensive participants. < 130/79mm HG in hypertensive participants with diabetes, heart failure or chronic kidney disease.;Long Term: Maintenance of blood pressure at goal levels.   Lipids Yes   Intervention Provide education and support for participant on nutrition & aerobic/resistive exercise along with prescribed medications to achieve LDL 70mg , HDL >40mg .   Expected Outcomes Short Term: Participant states understanding of desired cholesterol values and is compliant with medications prescribed. Participant is following exercise prescription and nutrition guidelines.;Long Term: Cholesterol controlled with medications as prescribed, with individualized exercise RX and with personalized nutrition plan. Value goals: LDL < 70mg , HDL > 40 mg.      Core Components/Risk Factors/Patient Goals Review:    Core Components/Risk Factors/Patient Goals at  Discharge (Final Review):    ITP Comments:     ITP Comments    Row Name 02/13/16 1252           ITP Comments REady to start Cardiac REhab. He is tired of sitting at home waiting to get clearance to go back to work.           Comments:

## 2016-02-13 NOTE — Patient Instructions (Signed)
Patient Instructions  Patient Details  Name: Adam Patterson MRN: [id number] Date of Birth: 1961-04-25 Referring Provider:  Adrian Prows, MD  Below are the personal goals you chose as well as exercise and nutrition goals. Our goal is to help you keep on track towards obtaining and maintaining your goals. We will be discussing your progress on these goals with you throughout the program.  Initial Exercise Prescription:     Initial Exercise Prescription - 02/13/16 1200      Date of Initial Exercise RX and Referring Provider   Date 02/13/16   Referring Provider Ganji     Treadmill   MPH 3.4   Grade 0.5   Minutes 15   METs 3.84     Recumbant Bike   Level 3   RPM 60   Minutes 15   METs 3.8     Elliptical   Level 1   Speed 3     REL-XR   Level 3   Minutes 15   METs 3.8     T5 Nustep   Level 4   Minutes 15   METs 3.8     Prescription Details   Frequency (times per week) 3   Duration Progress to 45 minutes of aerobic exercise without signs/symptoms of physical distress     Intensity   THRR 40-80% of Max Heartrate 104-144   Ratings of Perceived Exertion 11-13   Perceived Dyspnea 0-4     Progression   Progression Continue to progress workloads to maintain intensity without signs/symptoms of physical distress.     Resistance Training   Training Prescription Yes   Weight 3   Reps 10-15      Exercise Goals: Frequency: Be able to perform aerobic exercise three times per week working toward 3-5 days per week.  Intensity: Work with a perceived exertion of 11 (fairly light) - 15 (hard) as tolerated. Follow your new exercise prescription and watch for changes in prescription as you progress with the program. Changes will be reviewed with you when they are made.  Duration: You should be able to do 30 minutes of continuous aerobic exercise in addition to a 5 minute warm-up and a 5 minute cool-down routine.  Nutrition Goals: Your personal nutrition goals will be  established when you do your nutrition analysis with the dietician.  The following are nutrition guidelines to follow: Cholesterol < 200mg /day Sodium < 1500mg /day Fiber: Men over 50 yrs - 30 grams per day  Personal Goals:     Personal Goals and Risk Factors at Admission - 02/13/16 1252      Core Components/Risk Factors/Patient Goals on Admission    Weight Management Obesity;Weight Maintenance;Weight Loss   Increase Strength and Stamina Yes   Intervention Provide advice, education, support and counseling about physical activity/exercise needs.;Develop an individualized exercise prescription for aerobic and resistive training based on initial evaluation findings, risk stratification, comorbidities and participant's personal goals.   Expected Outcomes Achievement of increased cardiorespiratory fitness and enhanced flexibility, muscular endurance and strength shown through measurements of functional capacity and personal statement of participant.   Diabetes Yes   Intervention Provide education about signs/symptoms and action to take for hypo/hyperglycemia.;Provide education about proper nutrition, including hydration, and aerobic/resistive exercise prescription along with prescribed medications to achieve blood glucose in normal ranges: Fasting glucose 65-99 mg/dL   Expected Outcomes Short Term: Participant verbalizes understanding of the signs/symptoms and immediate care of hyper/hypoglycemia, proper foot care and importance of medication, aerobic/resistive exercise and nutrition plan for blood  glucose control.;Long Term: Attainment of HbA1C < 7%.   Hypertension Yes   Intervention Provide education on lifestyle modifcations including regular physical activity/exercise, weight management, moderate sodium restriction and increased consumption of fresh fruit, vegetables, and low fat dairy, alcohol moderation, and smoking cessation.;Monitor prescription use compliance.   Expected Outcomes Short Term:  Continued assessment and intervention until BP is < 140/70mm HG in hypertensive participants. < 130/39mm HG in hypertensive participants with diabetes, heart failure or chronic kidney disease.;Long Term: Maintenance of blood pressure at goal levels.   Lipids Yes   Intervention Provide education and support for participant on nutrition & aerobic/resistive exercise along with prescribed medications to achieve LDL 70mg , HDL >40mg .   Expected Outcomes Short Term: Participant states understanding of desired cholesterol values and is compliant with medications prescribed. Participant is following exercise prescription and nutrition guidelines.;Long Term: Cholesterol controlled with medications as prescribed, with individualized exercise RX and with personalized nutrition plan. Value goals: LDL < 70mg , HDL > 40 mg.      Tobacco Use Initial Evaluation: History  Smoking Status  . Former Smoker  . Types: Pipe  . Quit date: 03/30/2010  Smokeless Tobacco  . Never Used    Copy of goals given to participant.

## 2016-02-13 NOTE — Progress Notes (Signed)
Daily Session Note  Patient Details  Name: Adam Patterson MRN: [id number] Date of Birth: November 10, 1960 Referring Provider:   Flowsheet Row Cardiac Rehab from 02/13/2016 in [facility] Cardiac and Pulmonary Rehab  Referring Provider  Ganji      Encounter Date: 02/13/2016  Check In:     Session Check In - 02/13/16 1250      Check-In   Location [facility]-Cardiac & Pulmonary Rehab   Staff Present Gerlene Burdock, RN, Vickki Hearing, BA, ACSM CEP, Exercise Physiologist   Supervising physician immediately available to respond to emergencies See telemetry face sheet for immediately available ER MD   Medication changes reported     No   Fall or balance concerns reported    No   Warm-up and Cool-down Not performed (comment)  6 min walk test done   Resistance Training Performed No   VAD Patient? No     Pain Assessment   Currently in Pain? No/denies         Goals Met:  Personal goals reviewed  Goals Unmet:  Not Applicable  Comments:     Dr. Emily Filbert is Medical Director for [place] and [facility].

## 2016-02-18 ENCOUNTER — Encounter: Payer: 59 | Admitting: *Deleted

## 2016-02-18 DIAGNOSIS — Z955 Presence of coronary angioplasty implant and graft: Secondary | ICD-10-CM

## 2016-02-18 LAB — GLUCOSE, CAPILLARY
GLUCOSE-CAPILLARY: 94 mg/dL (ref 65–99)
Glucose-Capillary: 181 mg/dL — ABNORMAL HIGH (ref 65–99)

## 2016-02-18 NOTE — Progress Notes (Signed)
Daily Session Note  Patient Details  Name: Adam Patterson MRN: [id number] Date of Birth: March 10, 1961 Referring Provider:   Flowsheet Row Cardiac Rehab from 02/13/2016 in [facility] Cardiac and Pulmonary Rehab  Referring Provider  Einar Gip      Encounter Date: 02/18/2016  Check In:     Session Check In - 02/18/16 0846      Check-In   Location [facility]-Cardiac & Pulmonary Rehab   Staff Present Alberteen Sam, MA, ACSM RCEP, Exercise Physiologist;Kelly Amedeo Plenty, BS, ACSM CEP, Exercise Physiologist;Susanne Bice, RN, BSN, CCRP   Supervising physician immediately available to respond to emergencies See telemetry face sheet for immediately available ER MD   Medication changes reported     No   Fall or balance concerns reported    No   Warm-up and Cool-down Performed on first and last piece of equipment   Resistance Training Performed Yes   VAD Patient? No     Pain Assessment   Currently in Pain? No/denies   Multiple Pain Sites No         Goals Met:  Exercise tolerated well Personal goals reviewed No report of cardiac concerns or symptoms Strength training completed today  Goals Unmet:  Not Applicable  Comments: First full day of exercise!  Patient was oriented to gym and equipment including functions, settings, policies, and procedures.  Patient's individual exercise prescription and treatment plan were reviewed.  All starting workloads were established based on the results of the 6 minute walk test done at initial orientation visit.  The plan for exercise progression was also introduced and progression will be customized based on patient's performance and goals.    Dr. Emily Filbert is Medical Director for [place] and [facility].

## 2016-02-20 DIAGNOSIS — Z955 Presence of coronary angioplasty implant and graft: Secondary | ICD-10-CM

## 2016-02-20 LAB — GLUCOSE, CAPILLARY
Glucose-Capillary: 174 mg/dL — ABNORMAL HIGH (ref 65–99)
Glucose-Capillary: 73 mg/dL (ref 65–99)

## 2016-02-20 NOTE — Progress Notes (Signed)
Daily Session Note  Patient Details  Name: Adam Patterson MRN: [id number] Date of Birth: 02/23/1961 Referring Provider:   Flowsheet Row Cardiac Rehab from 02/13/2016 in [facility] Cardiac and Pulmonary Rehab  Referring Provider  Einar Gip      Encounter Date: 02/20/2016  Check In:     Session Check In - 02/20/16 0805      Check-In   Location [facility]-Cardiac & Pulmonary Rehab   Staff Present Alberteen Sam, MA, ACSM RCEP, Exercise Physiologist;Amanda Oletta Darter, BA, ACSM CEP, Exercise Physiologist;Carroll Enterkin, RN, BSN   Supervising physician immediately available to respond to emergencies See telemetry face sheet for immediately available ER MD   Medication changes reported     No   Fall or balance concerns reported    No   Warm-up and Cool-down Performed on first and last piece of equipment   Resistance Training Performed Yes   VAD Patient? No     Pain Assessment   Currently in Pain? No/denies           Exercise Prescription Changes - 02/19/16 1500      Exercise Review   Progression --  First full day of exercise     Response to Exercise   Blood Pressure (Admit) 130/70   Blood Pressure (Exercise) 140/88   Blood Pressure (Exit) 124/62   Heart Rate (Admit) 57 bpm   Heart Rate (Exercise) 93 bpm   Heart Rate (Exit) 51 bpm   Rating of Perceived Exertion (Exercise) 15   Symptoms none   Duration Progress to 45 minutes of aerobic exercise without signs/symptoms of physical distress   Intensity THRR unchanged     Progression   Progression Continue to progress workloads to maintain intensity without signs/symptoms of physical distress.   Average METs 4.25     Resistance Training   Training Prescription Yes   Weight 3 lbs   Reps 10-15     Interval Training   Interval Training No     Elliptical   Level 1   Speed 3     REL-XR   Level 3   Minutes 15   METs 2.5      Goals Met:  Independence with exercise equipment Exercise tolerated well Personal goals  reviewed Strength training completed today  Goals Unmet:  Not Applicable  Comments: Pt able to follow exercise prescription today without complaint.  Will continue to monitor for progression.    Dr. Emily Filbert is Medical Director for [place] and [facility].

## 2016-02-22 ENCOUNTER — Encounter: Payer: 59 | Admitting: *Deleted

## 2016-02-22 DIAGNOSIS — Z955 Presence of coronary angioplasty implant and graft: Secondary | ICD-10-CM | POA: Diagnosis not present

## 2016-02-22 LAB — GLUCOSE, CAPILLARY
GLUCOSE-CAPILLARY: 215 mg/dL — AB (ref 65–99)
GLUCOSE-CAPILLARY: 109 mg/dL — AB (ref 65–99)

## 2016-02-22 NOTE — Progress Notes (Signed)
Daily Session Note  Patient Details  Name: Adam Patterson MRN: [id number] Date of Birth: March 15, 1961 Referring Provider:   Flowsheet Row Cardiac Rehab from 02/13/2016 in [facility] Cardiac and Pulmonary Rehab  Referring Provider  Ganji      Encounter Date: 02/22/2016  Check In:     Session Check In - 02/22/16 0817      Check-In   Location [facility]-Cardiac & Pulmonary Rehab   Staff Present Heath Lark, RN, BSN, CCRP;Derril Franek, RN, Levie Heritage, MA, ACSM RCEP, Exercise Physiologist   Supervising physician immediately available to respond to emergencies See telemetry face sheet for immediately available ER MD   Medication changes reported     No   Fall or balance concerns reported    No   Warm-up and Cool-down Performed on first and last piece of equipment   Resistance Training Performed No   VAD Patient? No     Pain Assessment   Currently in Pain? No/denies         Goals Met:  Proper associated with RPD/PD & O2 Sat Exercise tolerated well  Goals Unmet:  Not Applicable  Comments: Pt able to follow exercise prescription today without complaint.  Will continue to monitor for progression.  Reviewed home exercise with pt today.  [place]  Reviewed THR, pulse, RPE, sign and symptoms, NTG use, and when to call 911 or MD.  Also discussed weather considerations and indoor options.  Pt voiced understanding.     Dr. Emily Filbert is Medical Director for [place] and [facility].

## 2016-02-25 ENCOUNTER — Encounter: Payer: 59 | Admitting: *Deleted

## 2016-02-25 DIAGNOSIS — I2111 ST elevation (STEMI) myocardial infarction involving right coronary artery: Secondary | ICD-10-CM | POA: Diagnosis not present

## 2016-02-25 DIAGNOSIS — Z955 Presence of coronary angioplasty implant and graft: Secondary | ICD-10-CM

## 2016-02-25 DIAGNOSIS — I10 Essential (primary) hypertension: Secondary | ICD-10-CM | POA: Diagnosis not present

## 2016-02-25 DIAGNOSIS — I2510 Atherosclerotic heart disease of native coronary artery without angina pectoris: Secondary | ICD-10-CM | POA: Diagnosis not present

## 2016-02-25 DIAGNOSIS — E782 Mixed hyperlipidemia: Secondary | ICD-10-CM | POA: Diagnosis not present

## 2016-02-25 NOTE — Progress Notes (Signed)
Daily Session Note  Patient Details  Name: Adam Patterson MRN: [id number] Date of Birth: 1961/02/19 Referring Provider:   Flowsheet Row Cardiac Rehab from 02/13/2016 in [facility] Cardiac and Pulmonary Rehab  Referring Provider  Ganji      Encounter Date: 02/25/2016  Check In:     Session Check In - 02/25/16 0808      Check-In   Location [facility]-Cardiac & Pulmonary Rehab   Staff Present Alberteen Sam, MA, ACSM RCEP, Exercise Physiologist;Kelly Amedeo Plenty, BS, ACSM CEP, Exercise Physiologist;Carroll Enterkin, RN, BSN   Supervising physician immediately available to respond to emergencies See telemetry face sheet for immediately available ER MD   Medication changes reported     No   Fall or balance concerns reported    No   Warm-up and Cool-down Performed on first and last piece of equipment   Resistance Training Performed Yes   VAD Patient? No     Pain Assessment   Currently in Pain? No/denies   Multiple Pain Sites No         Goals Met:  Independence with exercise equipment Exercise tolerated well No report of cardiac concerns or symptoms Strength training completed today  Goals Unmet:  Not Applicable  Comments: Pt able to follow exercise prescription today without complaint.  Will continue to monitor for progression.    Dr. Emily Filbert is Medical Director for [place] and [facility].

## 2016-02-26 DIAGNOSIS — E119 Type 2 diabetes mellitus without complications: Secondary | ICD-10-CM | POA: Diagnosis not present

## 2016-02-26 DIAGNOSIS — I10 Essential (primary) hypertension: Secondary | ICD-10-CM | POA: Diagnosis not present

## 2016-02-26 DIAGNOSIS — F329 Major depressive disorder, single episode, unspecified: Secondary | ICD-10-CM | POA: Diagnosis not present

## 2016-02-26 DIAGNOSIS — I252 Old myocardial infarction: Secondary | ICD-10-CM | POA: Diagnosis not present

## 2016-02-27 ENCOUNTER — Encounter: Payer: Self-pay | Admitting: *Deleted

## 2016-02-27 DIAGNOSIS — Z955 Presence of coronary angioplasty implant and graft: Secondary | ICD-10-CM

## 2016-02-27 LAB — GLUCOSE, CAPILLARY
Glucose-Capillary: 66 mg/dL (ref 65–99)
Glucose-Capillary: 83 mg/dL (ref 65–99)

## 2016-02-27 NOTE — Progress Notes (Signed)
Cardiac Individual Treatment Plan  Patient Details  Name: JAYMIN WALN MRN: [id number] Date of Birth: Nov 19, 1960 Referring Provider:   Flowsheet Row Cardiac Rehab from 02/13/2016 in [facility] Cardiac and Pulmonary Rehab  Referring Provider  Einar Gip      Initial Encounter Date:  Flowsheet Row Cardiac Rehab from 02/13/2016 in [facility] Cardiac and Pulmonary Rehab  Date  02/13/16  Referring Provider  Einar Gip      Visit Diagnosis: S/P coronary artery stent placement  Patient's Home Medications on Admission:  Current Outpatient Prescriptions:  .  amLODipine-valsartan (EXFORGE) 10-320 MG tablet, Take 0.5 tablets by mouth daily., Disp: 30 tablet, Rfl: 3 .  aspirin 81 MG tablet, Take 81 mg by mouth daily.  , Disp: , Rfl:  .  carvedilol (COREG) 3.125 MG tablet, Take 1 tablet (3.125 mg total) by mouth 2 (two) times daily with a meal., Disp: 60 tablet, Rfl: 0 .  Cholecalciferol (VITAMIN D3) 2000 UNITS TABS, Take 1 tablet by mouth daily.  , Disp: , Rfl:  .  DULoxetine (CYMBALTA) 60 MG capsule, Take 1 capsule (60 mg total) by mouth daily., Disp: 30 capsule, Rfl: 3 .  fish oil-omega-3 fatty acids 1000 MG capsule, Take 2 g by mouth 2 (two) times daily.  , Disp: , Rfl:  .  FLUoxetine (PROZAC) 20 MG tablet, Take 1 tablet by mouth daily., Disp: , Rfl: 3 .  glimepiride (AMARYL) 4 MG tablet, Take 4 mg by mouth daily with breakfast., Disp: , Rfl:  .  Insulin Pen Needle (NOVOFINE) 30G X 8 MM MISC, Patient test blood sugar two times a day. (Patient not taking: Reported on 02/13/2016), Disp: 100 each, Rfl: 6 .  metFORMIN (GLUCOPHAGE) 1000 MG tablet, Take 1 tablet (1,000 mg total) by mouth 2 (two) times daily., Disp: , Rfl: 3 .  Multiple Vitamin (MULTIVITAMIN PO), Take 1 tablet by mouth daily.  , Disp: , Rfl:  .  mupirocin ointment (BACTROBAN) 2 %, Place 1 application into the nose 2 (two) times daily., Disp: 22 g, Rfl: 0 .  nitroGLYCERIN (NITROSTAT) 0.4 MG SL tablet, Place 1 tablet (0.4 mg total) under the tongue every  5 (five) minutes x 3 doses as needed for chest pain., Disp: 25 tablet, Rfl: 1 .  rosuvastatin (CRESTOR) 40 MG tablet, Take 1 tablet by mouth daily., Disp: , Rfl: 3 .  ticagrelor (BRILINTA) 90 MG TABS tablet, Take 1 tablet (90 mg total) by mouth 2 (two) times daily., Disp: 60 tablet, Rfl: 11  Past Medical History: Past Medical History:  Diagnosis Date  . Abnormal liver enzymes 2006   no prior workup  . Diabetes mellitus   . Hyperlipidemia   . Hypertension   . Myocardial infarction ([place])   . Obesity   . Psoriasis    affecting hands (skin)  . Sleep apnea sleep study 2007   obstructive, not using mask    Tobacco Use: History  Smoking Status  . Former Smoker  . Types: Pipe  . Quit date: 03/30/2010  Smokeless Tobacco  . Never Used    Labs: Recent Review Flowsheet Data    Labs for ITP Cardiac and Pulmonary Rehab Latest Ref Rng & Units 02/04/2016 02/05/2016   Cholestrol 0 - 200 mg/dL 113 -   LDLCALC 0 - 99 mg/dL 49 -   HDL >40 mg/dL 54 -   Trlycerides <150 mg/dL 48 -   Hemoglobin A1c 4.8 - 5.6 % - 6.5(H)       Exercise Target Goals:  Exercise Program Goal: Individual exercise prescription set with THRR, safety & activity barriers. Participant demonstrates ability to understand and report RPE using BORG scale, to self-measure pulse accurately, and to acknowledge the importance of the exercise prescription.  Exercise Prescription Goal: Starting with aerobic activity 30 plus minutes a day, 3 days per week for initial exercise prescription. Provide home exercise prescription and guidelines that participant acknowledges understanding prior to discharge.  Activity Barriers & Risk Stratification:     Activity Barriers & Cardiac Risk Stratification - 02/13/16 1249      Activity Barriers & Cardiac Risk Stratification   Activity Barriers None   Cardiac Risk Stratification High      6 Minute Walk:     6 Minute Walk    Row Name 02/13/16 1242         6 Minute Walk    Distance 1645 feet     Walk Time 6 minutes     MPH 3.1     METS 3.81     RPE 11     VO2 Peak 13.4     Symptoms No     Resting HR 65 bpm     Resting BP 152/80     Max Ex. HR 104 bpm     Max Ex. BP 168/78        Initial Exercise Prescription:     Initial Exercise Prescription - 02/13/16 1200      Date of Initial Exercise RX and Referring Provider   Date 02/13/16   Referring Provider Ganji     Treadmill   MPH 3.4   Grade 0.5   Minutes 15   METs 3.84     Recumbant Bike   Level 3   RPM 60   Minutes 15   METs 3.8     Elliptical   Level 1   Speed 3     REL-XR   Level 3   Minutes 15   METs 3.8     T5 Nustep   Level 4   Minutes 15   METs 3.8     Prescription Details   Frequency (times per week) 3   Duration Progress to 45 minutes of aerobic exercise without signs/symptoms of physical distress     Intensity   THRR 40-80% of Max Heartrate 104-144   Ratings of Perceived Exertion 11-13   Perceived Dyspnea 0-4     Progression   Progression Continue to progress workloads to maintain intensity without signs/symptoms of physical distress.     Resistance Training   Training Prescription Yes   Weight 3   Reps 10-15      Perform Capillary Blood Glucose checks as needed.  Exercise Prescription Changes:     Exercise Prescription Changes    Row Name 02/19/16 1500             Exercise Review   Progression -  First full day of exercise         Response to Exercise   Blood Pressure (Admit) 130/70       Blood Pressure (Exercise) 140/88       Blood Pressure (Exit) 124/62       Heart Rate (Admit) 57 bpm       Heart Rate (Exercise) 93 bpm       Heart Rate (Exit) 51 bpm       Rating of Perceived Exertion (Exercise) 15       Symptoms none       Duration Progress to  45 minutes of aerobic exercise without signs/symptoms of physical distress       Intensity THRR unchanged         Progression   Progression Continue to progress workloads to maintain  intensity without signs/symptoms of physical distress.       Average METs 4.25         Resistance Training   Training Prescription Yes       Weight 3 lbs       Reps 10-15         Interval Training   Interval Training No         Elliptical   Level 1       Speed 3         REL-XR   Level 3       Minutes 15       METs 2.5          Exercise Comments:     Exercise Comments    Row Name 02/18/16 0849 02/20/16 1002 02/22/16 1000 02/22/16 1013     Exercise Comments First full day of exercise!  Patient was oriented to gym and equipment including functions, settings, policies, and procedures.  Patient's individual exercise prescription and treatment plan were reviewed.  All starting workloads were established based on the results of the 6 minute walk test done at initial orientation visit.  The plan for exercise progression was also introduced and progression will be customized based on patient's performance and goals. Geoffer said today that he feels so happy to be able to move again!! Reviewed METs average and discussed progression with pt today. Reviewed home exercise with pt today.  Pt [place] walk at the mall for exercise.  Reviewed THR, pulse, RPE, sign and symptoms, NTG use, and when to call 911 or MD.  Also discussed weather considerations and indoor options.  Pt voiced understanding.       Discharge Exercise Prescription (Final Exercise Prescription Changes):     Exercise Prescription Changes - 02/19/16 1500      Exercise Review   Progression --  First full day of exercise     Response to Exercise   Blood Pressure (Admit) 130/70   Blood Pressure (Exercise) 140/88   Blood Pressure (Exit) 124/62   Heart Rate (Admit) 57 bpm   Heart Rate (Exercise) 93 bpm   Heart Rate (Exit) 51 bpm   Rating of Perceived Exertion (Exercise) 15   Symptoms none   Duration Progress to 45 minutes of aerobic exercise without signs/symptoms of physical distress   Intensity THRR unchanged      Progression   Progression Continue to progress workloads to maintain intensity without signs/symptoms of physical distress.   Average METs 4.25     Resistance Training   Training Prescription Yes   Weight 3 lbs   Reps 10-15     Interval Training   Interval Training No     Elliptical   Level 1   Speed 3     REL-XR   Level 3   Minutes 15   METs 2.5      Nutrition:  Target Goals: Understanding of nutrition guidelines, daily intake of sodium <1556m, cholesterol <2045m calories 30% from fat and 7% or less from saturated fats, daily to have 5 or more servings of fruits and vegetables.  Biometrics:     Pre Biometrics - 02/13/16 1241      Pre Biometrics   Height _0  (1.778 m)   Weight 281  lb 9.6 oz (127.7 kg)   Waist Circumference 50.13 inches   Hip Circumference 58 inches   Waist to Hip Ratio 0.86 %   BMI (Calculated) 40.5   Single Leg Stand 30 seconds       Nutrition Therapy Plan and Nutrition Goals:     Nutrition Therapy & Goals - 02/19/16 1151      Nutrition Therapy   Diet Instructed on a 2000 calorie meal plan including heart healthy and diabetes dietary guidelines   Protein (specify units) 8   Fiber 30 grams   Whole Grain Foods 3 servings   Saturated Fats 13 max. grams   Fruits and Vegetables 5 servings/day  range of 5-8 servings daily   Sodium 2000 grams  1500 ideal     Personal Nutrition Goals   Personal Goal #1 Balance meals with protein, 2-4 servings of carbohydrate and non-starchy vegetables.   Personal Goal #2 Try low sodium products discussed such as [employer] very low sodium tuna, [employer] brand taco bowls, [place] low sodium sauce   Personal Goal #3 Read labels for saturated fat, trans fat and sodium.   Comments Patient has made significant positive diet changes and overall is already following a heart healthy meal plan. Reviewed recommended servings of carbohydrate on this calorie level.     Intervention Plan   Intervention Prescribe,  educate and counsel regarding individualized specific dietary modifications aiming towards targeted core components such as weight, hypertension, lipid management, diabetes, heart failure and other comorbidities.;Nutrition handout(s) given to patient.   Expected Outcomes Short Term Goal: Understand basic principles of dietary content, such as calories, fat, sodium, cholesterol and nutrients.;Short Term Goal: A plan has been developed with personal nutrition goals set during dietitian appointment.;Long Term Goal: Adherence to prescribed nutrition plan.      Nutrition Discharge: Rate Your Plate Scores:     Nutrition Assessments - 02/25/16 1534      Rate Your Plate Scores   Pre Score 68   Pre Score % 75.5 %      Nutrition Goals Re-Evaluation:   Psychosocial: Target Goals: Acknowledge presence or absence of depression, maximize coping skills, provide positive support system. Participant is able to verbalize types and ability to use techniques and skills needed for reducing stress and depression.  Initial Review & Psychosocial Screening:     Initial Psych Review & Screening - 02/13/16 1253      Initial Review   Current issues with History of Depression     Family Dynamics   Good Support System? Yes     Screening Interventions   Interventions Encouraged to exercise;Program counselor consult      Quality of Life Scores:     Quality of Life - 02/13/16 1319      Quality of Life Scores   Health/Function Pre 13.6 %   Socioeconomic Pre 16.07 %   Psych/Spiritual Pre 16 %   Family Pre 22.8 %   GLOBAL Pre 15.95 %      PHQ-9: Recent Review Flowsheet Data    Depression screen [facility] 2/9 02/13/2016   Decreased Interest 0   Down, Depressed, Hopeless 1   PHQ - 2 Score 1   Altered sleeping 0   Tired, decreased energy 0   Feeling bad or failure about yourself  3   Trouble concentrating 0   Moving slowly or fidgety/restless 0   Suicidal thoughts 0   PHQ-9 Score 4       Psychosocial Evaluation and Intervention:  Psychosocial Evaluation - 02/20/16 1000      Psychosocial Evaluation & Interventions   Comments Follow with Mr. Davids today stating he is already experiencing more energy since starting this class, and was excited to report he made a healthy dinner last night and slept well after the first class.  Counselor will continue to follow with Mr. Grupe.         Psychosocial Re-Evaluation:   Vocational Rehabilitation: Provide vocational rehab assistance to qualifying candidates.   Vocational Rehab Evaluation & Intervention:     Vocational Rehab - 02/13/16 1250      Initial Vocational Rehab Evaluation & Intervention   Assessment shows need for Vocational Rehabilitation No      Education: Education Goals: Education classes will be provided on a weekly basis, covering required topics. Participant will state understanding/return demonstration of topics presented.  Learning Barriers/Preferences:     Learning Barriers/Preferences - 02/13/16 1249      Learning Barriers/Preferences   Learning Barriers None   Learning Preferences Verbal Instruction;Individual Instruction      Education Topics: General Nutrition Guidelines/Fats and Fiber: -Group instruction provided by verbal, written material, models and posters to present the general guidelines for heart healthy nutrition. Gives an explanation and review of dietary fats and fiber.   Controlling Sodium/Reading Food Labels: -Group verbal and written material supporting the discussion of sodium use in heart healthy nutrition. Review and explanation with models, verbal and written materials for utilization of the food label. Flowsheet Row Cardiac Rehab from 02/25/2016 in [facility] Cardiac and Pulmonary Rehab  Date  02/18/16  Educator  CR  Instruction Review Code  2- meets goals/outcomes      Exercise Physiology & Risk Factors: - Group verbal and written instruction with models to  review the exercise physiology of the cardiovascular system and associated critical values. Details cardiovascular disease risk factors and the goals associated with each risk factor.   Aerobic Exercise & Resistance Training: - Gives group verbal and written discussion on the health impact of inactivity. On the components of aerobic and resistive training programs and the benefits of this training and how to safely progress through these programs. Flowsheet Row Cardiac Rehab from 02/25/2016 in [facility] Cardiac and Pulmonary Rehab  Date  02/20/16  Educator  [facility]  Instruction Review Code  2- meets goals/outcomes      Flexibility, Balance, General Exercise Guidelines: - Provides group verbal and written instruction on the benefits of flexibility and balance training programs. Provides general exercise guidelines with specific guidelines to those with heart or lung disease. Demonstration and skill practice provided. Flowsheet Row Cardiac Rehab from 02/25/2016 in [facility] Cardiac and Pulmonary Rehab  Date  02/25/16  Educator  [facility]  Instruction Review Code  2- meets goals/outcomes      Stress Management: - Provides group verbal and written instruction about the health risks of elevated stress, cause of high stress, and healthy ways to reduce stress.   Depression: - Provides group verbal and written instruction on the correlation between heart/lung disease and depressed mood, treatment options, and the stigmas associated with seeking treatment.   Anatomy & Physiology of the Heart: - Group verbal and written instruction and models provide basic cardiac anatomy and physiology, with the coronary electrical and arterial systems. Review of: AMI, Angina, Valve disease, Heart Failure, Cardiac Arrhythmia, Pacemakers, and the ICD.   Cardiac Procedures: - Group verbal and written instruction and models to describe the testing methods done to diagnose heart disease. Reviews the outcomes of the  test results.  Describes the treatment choices: Medical Management, Angioplasty, or Coronary Bypass Surgery.   Cardiac Medications: - Group verbal and written instruction to review commonly prescribed medications for heart disease. Reviews the medication, class of the drug, and side effects. Includes the steps to properly store meds and maintain the prescription regimen.   Go Sex-Intimacy & Heart Disease, Get SMART - Goal Setting: - Group verbal and written instruction through game format to discuss heart disease and the return to sexual intimacy. Provides group verbal and written material to discuss and apply goal setting through the application of the S.M.A.R.T. Method.   Other Matters of the Heart: - Provides group verbal, written materials and models to describe Heart Failure, Angina, Valve Disease, and Diabetes in the realm of heart disease. Includes description of the disease process and treatment options available to the cardiac patient.   Exercise & Equipment Safety: - Individual verbal instruction and demonstration of equipment use and safety with use of the equipment. Flowsheet Row Cardiac Rehab from 02/25/2016 in [facility] Cardiac and Pulmonary Rehab  Date  02/13/16  Educator  C. EnterkinRN  Instruction Review Code  1- partially meets, needs review/practice      Infection Prevention: - Provides verbal and written material to individual with discussion of infection control including proper hand washing and proper equipment cleaning during exercise session. Flowsheet Row Cardiac Rehab from 02/25/2016 in [facility] Cardiac and Pulmonary Rehab  Date  02/13/16  Educator  C. Enterkin,RN  Instruction Review Code  2- meets goals/outcomes      Falls Prevention: - Provides verbal and written material to individual with discussion of falls prevention and safety. Flowsheet Row Cardiac Rehab from 02/25/2016 in [facility] Cardiac and Pulmonary Rehab  Date  02/13/16  Educator  C. Alafaya  Instruction Review Code   2- meets goals/outcomes      Diabetes: - Individual verbal and written instruction to review signs/symptoms of diabetes, desired ranges of glucose level fasting, after meals and with exercise. Advice that pre and post exercise glucose checks will be done for 3 sessions at entry of program. [place] from 02/25/2016 in [facility] Cardiac and Pulmonary Rehab  Date  02/13/16  Educator  C. EnterkinRN  Instruction Review Code  2- meets goals/outcomes       Knowledge Questionnaire Score:     Knowledge Questionnaire Score - 02/13/16 1320      Knowledge Questionnaire Score   Pre Score 27      Core Components/Risk Factors/Patient Goals at Admission:     Personal Goals and Risk Factors at Admission - 02/13/16 1252      Core Components/Risk Factors/Patient Goals on Admission    Weight Management Obesity;Weight Maintenance;Weight Loss   Increase Strength and Stamina Yes   Intervention Provide advice, education, support and counseling about physical activity/exercise needs.;Develop an individualized exercise prescription for aerobic and resistive training based on initial evaluation findings, risk stratification, comorbidities and participant's personal goals.   Expected Outcomes Achievement of increased cardiorespiratory fitness and enhanced flexibility, muscular endurance and strength shown through measurements of functional capacity and personal statement of participant.   Diabetes Yes   Intervention Provide education about signs/symptoms and action to take for hypo/hyperglycemia.;Provide education about proper nutrition, including hydration, and aerobic/resistive exercise prescription along with prescribed medications to achieve blood glucose in normal ranges: Fasting glucose 65-99 mg/dL   Expected Outcomes Short Term: Participant verbalizes understanding of the signs/symptoms and immediate care of hyper/hypoglycemia, proper foot care and importance of medication,  aerobic/resistive  exercise and nutrition plan for blood glucose control.;Long Term: Attainment of HbA1C < 7%.   Hypertension Yes   Intervention Provide education on lifestyle modifcations including regular physical activity/exercise, weight management, moderate sodium restriction and increased consumption of fresh fruit, vegetables, and low fat dairy, alcohol moderation, and smoking cessation.;Monitor prescription use compliance.   Expected Outcomes Short Term: Continued assessment and intervention until BP is < 140/38m HG in hypertensive participants. < 130/861mHG in hypertensive participants with diabetes, heart failure or chronic kidney disease.;Long Term: Maintenance of blood pressure at goal levels.   Lipids Yes   Intervention Provide education and support for participant on nutrition & aerobic/resistive exercise along with prescribed medications to achieve LDL <7030mHDL >39m69m Expected Outcomes Short Term: Participant states understanding of desired cholesterol values and is compliant with medications prescribed. Participant is following exercise prescription and nutrition guidelines.;Long Term: Cholesterol controlled with medications as prescribed, with individualized exercise RX and with personalized nutrition plan. Value goals: LDL < 70mg18mL > 40 mg.      Core Components/Risk Factors/Patient Goals Review:      Goals and Risk Factor Review    Row Name 02/20/16 1002             Core Components/Risk Factors/Patient Goals Review   Personal Goals Review Weight Management/Obesity;Sedentary;Increase Strength and Stamina;Diabetes;Lipids;Hypertension       Review Adrick is on day two today with exercise.  He is already feeling better, stating that it feels so good to be moving again.  We reviewed his goals and risk factors that we will be focusing on during rehab.  He has already met with the dietician and has started to make changes to his diet.       Expected Outcomes Jaamal will  continue to come to education and exercise classes to learn more about his risk factors and improve his health.  We will continue to monitor his progress.          Core Components/Risk Factors/Patient Goals at Discharge (Final Review):      Goals and Risk Factor Review - 02/20/16 1002      Core Components/Risk Factors/Patient Goals Review   Personal Goals Review Weight Management/Obesity;Sedentary;Increase Strength and Stamina;Diabetes;Lipids;Hypertension   Review Ruairi is on day two today with exercise.  He is already feeling better, stating that it feels so good to be moving again.  We reviewed his goals and risk factors that we will be focusing on during rehab.  He has already met with the dietician and has started to make changes to his diet.   Expected Outcomes Syre will continue to come to education and exercise classes to learn more about his risk factors and improve his health.  We will continue to monitor his progress.      ITP Comments:     ITP Comments    Row Name 02/13/16 1252 02/27/16 0747         ITP Comments REady to start Cardiac REhab. He is tired of sitting at home waiting to get clearance to go back to work.  30 day review. Continue with ITP unless changes noted by Medical Director at signature of review.         Comments:

## 2016-02-27 NOTE — Progress Notes (Signed)
Daily Session Note  Patient Details  Name: Adam Patterson MRN: [id number] Date of Birth: [birth year] Referring Provider:   Flowsheet Row Cardiac Rehab from 02/13/2016 in [facility] Cardiac and Pulmonary Rehab  Referring Provider  Ganji      Encounter Date: 02/27/2016  Check In:     Session Check In - 02/27/16 0839      Check-In   Location [facility]-Cardiac & Pulmonary Rehab   Staff Present Alberteen Sam, MA, ACSM RCEP, Exercise Physiologist;Amanda Oletta Darter, BA, ACSM CEP, Exercise Physiologist;Carroll Enterkin, RN, BSN   Supervising physician immediately available to respond to emergencies See telemetry face sheet for immediately available ER MD   Medication changes reported     No   Fall or balance concerns reported    No   Warm-up and Cool-down Performed on first and last piece of equipment   Resistance Training Performed Yes   VAD Patient? No     Pain Assessment   Currently in Pain? No/denies         Goals Met:  Proper associated with RPD/PD & O2 Sat [place] with exercise equipment No report of cardiac concerns or symptoms Strength training completed today  Goals Unmet:  Not Applicable  Comments: Pt able to follow exercise prescription today without complaint.  Will continue to monitor for progression.    Dr. Emily Filbert is Medical Director for [facility].

## 2016-03-05 ENCOUNTER — Encounter: Payer: 59 | Admitting: *Deleted

## 2016-03-05 DIAGNOSIS — Z955 Presence of coronary angioplasty implant and graft: Secondary | ICD-10-CM

## 2016-03-05 NOTE — Progress Notes (Signed)
Daily Session Note  Patient Details  Name: Adam Patterson MRN: [id number] Date of Birth: May 26, 1961 Referring Provider:   Flowsheet Row Cardiac Rehab from 02/13/2016 in [facility] Cardiac [facility] Rehab  Referring Provider  Ganji      Encounter Date: 03/05/2016  Check In:     Session Check In - 03/05/16 0957      Check-In   Location [facility]-Cardiac & Pulmonary Rehab   Staff Present Nyoka Cowden, RN, BSN, Willette Pa, MA, ACSM RCEP, Exercise Physiologist;Amanda Oletta Darter, [place], ACSM CEP, Exercise Physiologist   Supervising physician immediately available to respond to emergencies See telemetry face sheet for immediately available ER MD   Medication changes reported     No   Fall or balance concerns reported    No   Warm-up and Cool-down Performed on first and last piece of equipment   Resistance Training Performed Yes   VAD Patient? No     Pain Assessment   Currently in Pain? No/denies   Multiple Pain Sites No         Goals Met:  Independence with exercise equipment Exercise tolerated well Personal goals reviewed No report of cardiac concerns or symptoms Strength training completed today  Goals Unmet:  Not Applicable  Comments: Pt able to follow exercise prescription today without complaint.  Will continue to monitor for progression.    Dr. Emily Filbert is Medical Director for [place] and [facility].

## 2016-03-07 ENCOUNTER — Encounter: Payer: 59 | Admitting: *Deleted

## 2016-03-07 DIAGNOSIS — Z955 Presence of coronary angioplasty implant and graft: Secondary | ICD-10-CM

## 2016-03-07 NOTE — Progress Notes (Signed)
Daily Session Note  Patient Details  Name: Adam Patterson MRN: [id number] Date of Birth: 1961/02/21 Referring Provider:   Flowsheet Row Cardiac Rehab from 02/13/2016 in [facility] Re[facility]  Referring Provider  Ganji      Encounter Date: 03/07/2016  Check In:     Session Check In - 03/07/16 6568      Check-In   Location [facility]-Cardiac & Pulmonary Rehab   Staff Present Nyoka Cowden, RN, BSN, MA;Susanne Bice, RN, BSN, [place], MA, ACSM RCEP, Exercise Physiologist   Supervising physician immediately available to respond to emergencies See telemetry face sheet for immediately available ER MD   Medication changes reported     No   Fall or balance concerns reported    No   Warm-up and Cool-down Performed on first and last piece of equipment   Resistance Training Performed Yes   VAD Patient? No     Pain Assessment   Currently in Pain? No/denies   Multiple Pain Sites No           Exercise Prescription Changes - 03/06/16 1100      Exercise Review   Progression Yes     Response to Exercise   Blood Pressure (Admit) 116/64   Blood Pressure (Exercise) 136/74   Blood Pressure (Exit) 120/82   Heart Rate (Admit) 105 bpm   Heart Rate (Exercise) 109 bpm   Heart Rate (Exit) 75 bpm   Rating of Perceived Exertion (Exercise) 15   Symptoms none   Comments Home Exercise Guidelines given 02/22/16   Duration Progress to 45 minutes of aerobic exercise without signs/symptoms of physical distress   Intensity THRR unchanged     Progression   Progression Continue to progress workloads to maintain intensity without signs/symptoms of physical distress.   Average METs 4.02     Resistance Training   Training Prescription Yes   Weight 5 lbs   Reps 10-15     Interval Training   Interval Training No     Treadmill   MPH 3.4   Grade 1   Minutes 15   METs 4.07     REL-XR   Level 5   Minutes 15   METs 5.4     T5 Nustep   Level 6   Minutes 15   METs  2.6     Home Exercise Plan   Plans to continue exercise at Home  walk at home and at mall   Frequency Add 3 additional days to program exercise sessions.      Goals Met:  Independence with exercise equipment Exercise tolerated well No report of cardiac concerns or symptoms Strength training completed today  Goals Unmet:  Not Applicable  Comments: Pt able to follow exercise prescription today without complaint.  Will continue to monitor for progression.    Dr. Emily Filbert is Medical Director for [place] and [facility].

## 2016-03-14 ENCOUNTER — Encounter: Payer: 59 | Attending: Cardiology

## 2016-03-14 DIAGNOSIS — Z955 Presence of coronary angioplasty implant and graft: Secondary | ICD-10-CM | POA: Insufficient documentation

## 2016-03-19 ENCOUNTER — Encounter: Payer: Self-pay | Admitting: *Deleted

## 2016-03-26 ENCOUNTER — Encounter: Payer: Self-pay | Admitting: *Deleted

## 2016-03-26 DIAGNOSIS — Z955 Presence of coronary angioplasty implant and graft: Secondary | ICD-10-CM

## 2016-03-26 NOTE — Progress Notes (Signed)
Cardiac Individual Treatment Plan  Patient Details  Name: Adam Patterson MRN: [id number] Date of Birth: Nov 19, 1960 Referring Provider:   Flowsheet Row Cardiac Rehab from 02/13/2016 in [facility] Cardiac and Pulmonary Rehab  Referring Provider  Einar Gip      Initial Encounter Date:  Flowsheet Row Cardiac Rehab from 02/13/2016 in [facility] Cardiac and Pulmonary Rehab  Date  02/13/16  Referring Provider  Einar Gip      Visit Diagnosis: S/P coronary artery stent placement  Patient's Home Medications on Admission:  Current Outpatient Prescriptions:  .  amLODipine-valsartan (EXFORGE) 10-320 MG tablet, Take 0.5 tablets by mouth daily., Disp: 30 tablet, Rfl: 3 .  aspirin 81 MG tablet, Take 81 mg by mouth daily.  , Disp: , Rfl:  .  carvedilol (COREG) 3.125 MG tablet, Take 1 tablet (3.125 mg total) by mouth 2 (two) times daily with a meal., Disp: 60 tablet, Rfl: 0 .  Cholecalciferol (VITAMIN D3) 2000 UNITS TABS, Take 1 tablet by mouth daily.  , Disp: , Rfl:  .  DULoxetine (CYMBALTA) 60 MG capsule, Take 1 capsule (60 mg total) by mouth daily., Disp: 30 capsule, Rfl: 3 .  fish oil-omega-3 fatty acids 1000 MG capsule, Take 2 g by mouth 2 (two) times daily.  , Disp: , Rfl:  .  FLUoxetine (PROZAC) 20 MG tablet, Take 1 tablet by mouth daily., Disp: , Rfl: 3 .  glimepiride (AMARYL) 4 MG tablet, Take 4 mg by mouth daily with breakfast., Disp: , Rfl:  .  Insulin Pen Needle (NOVOFINE) 30G X 8 MM MISC, Patient test blood sugar two times a day. (Patient not taking: Reported on 02/13/2016), Disp: 100 each, Rfl: 6 .  metFORMIN (GLUCOPHAGE) 1000 MG tablet, Take 1 tablet (1,000 mg total) by mouth 2 (two) times daily., Disp: , Rfl: 3 .  Multiple Vitamin (MULTIVITAMIN PO), Take 1 tablet by mouth daily.  , Disp: , Rfl:  .  mupirocin ointment (BACTROBAN) 2 %, Place 1 application into the nose 2 (two) times daily., Disp: 22 g, Rfl: 0 .  nitroGLYCERIN (NITROSTAT) 0.4 MG SL tablet, Place 1 tablet (0.4 mg total) under the tongue every  5 (five) minutes x 3 doses as needed for chest pain., Disp: 25 tablet, Rfl: 1 .  rosuvastatin (CRESTOR) 40 MG tablet, Take 1 tablet by mouth daily., Disp: , Rfl: 3 .  ticagrelor (BRILINTA) 90 MG TABS tablet, Take 1 tablet (90 mg total) by mouth 2 (two) times daily., Disp: 60 tablet, Rfl: 11  Past Medical History: Past Medical History:  Diagnosis Date  . Abnormal liver enzymes 2006   no prior workup  . Diabetes mellitus   . Hyperlipidemia   . Hypertension   . Myocardial infarction ([place])   . Obesity   . Psoriasis    affecting hands (skin)  . Sleep apnea sleep study 2007   obstructive, not using mask    Tobacco Use: History  Smoking Status  . Former Smoker  . Types: Pipe  . Quit date: 03/30/2010  Smokeless Tobacco  . Never Used    Labs: Recent Review Flowsheet Data    Labs for ITP Cardiac and Pulmonary Rehab Latest Ref Rng & Units 02/04/2016 02/05/2016   Cholestrol 0 - 200 mg/dL 113 -   LDLCALC 0 - 99 mg/dL 49 -   HDL >40 mg/dL 54 -   Trlycerides <150 mg/dL 48 -   Hemoglobin A1c 4.8 - 5.6 % - 6.5(H)       Exercise Target Goals:  Exercise Program Goal: Individual exercise prescription set with THRR, safety & activity barriers. Participant demonstrates ability to understand and report RPE using BORG scale, to self-measure pulse accurately, and to acknowledge the importance of the exercise prescription.  Exercise Prescription Goal: Starting with aerobic activity 30 plus minutes a day, 3 days per week for initial exercise prescription. Provide home exercise prescription and guidelines that participant acknowledges understanding prior to discharge.  Activity Barriers & Risk Stratification:     Activity Barriers & Cardiac Risk Stratification - 02/13/16 1249      Activity Barriers & Cardiac Risk Stratification   Activity Barriers None   Cardiac Risk Stratification High      6 Minute Walk:     6 Minute Walk    Row Name 02/13/16 1242         6 Minute Walk    Distance 1645 feet     Walk Time 6 minutes     MPH 3.1     METS 3.81     RPE 11     VO2 Peak 13.4     Symptoms No     Resting HR 65 bpm     Resting BP 152/80     Max Ex. HR 104 bpm     Max Ex. BP 168/78        Initial Exercise Prescription:     Initial Exercise Prescription - 02/13/16 1200      Date of Initial Exercise RX and Referring Provider   Date 02/13/16   Referring Provider Ganji     Treadmill   MPH 3.4   Grade 0.5   Minutes 15   METs 3.84     Recumbant Bike   Level 3   RPM 60   Minutes 15   METs 3.8     Elliptical   Level 1   Speed 3     REL-XR   Level 3   Minutes 15   METs 3.8     T5 Nustep   Level 4   Minutes 15   METs 3.8     Prescription Details   Frequency (times per week) 3   Duration Progress to 45 minutes of aerobic exercise without signs/symptoms of physical distress     Intensity   THRR 40-80% of Max Heartrate 104-144   Ratings of Perceived Exertion 11-13   Perceived Dyspnea 0-4     Progression   Progression Continue to progress workloads to maintain intensity without signs/symptoms of physical distress.     Resistance Training   Training Prescription Yes   Weight 3   Reps 10-15      Perform Capillary Blood Glucose checks as needed.  Exercise Prescription Changes:     Exercise Prescription Changes    Row Name 02/19/16 1500 03/06/16 1100 03/19/16 1600         Exercise Review   Progression -  First full day of exercise Yes Yes       Response to Exercise   Blood Pressure (Admit) 130/70 116/64 126/64     Blood Pressure (Exercise) 140/88 136/74 156/80     Blood Pressure (Exit) 124/62 120/82 132/62     Heart Rate (Admit) 57 bpm 105 bpm 74 bpm     Heart Rate (Exercise) 93 bpm 109 bpm 114 bpm     Heart Rate (Exit) 51 bpm 75 bpm 69 bpm     Rating of Perceived Exertion (Exercise) _0 Symptoms none none none  Comments  - Home Exercise Guidelines given 02/22/16 Home Exercise Guidelines given 02/22/16      Duration Progress to 45 minutes of aerobic exercise without signs/symptoms of physical distress Progress to 45 minutes of aerobic exercise without signs/symptoms of physical distress Progress to 45 minutes of aerobic exercise without signs/symptoms of physical distress     Intensity THRR unchanged THRR unchanged THRR unchanged       Progression   Progression Continue to progress workloads to maintain intensity without signs/symptoms of physical distress. Continue to progress workloads to maintain intensity without signs/symptoms of physical distress. Continue to progress workloads to maintain intensity without signs/symptoms of physical distress.     Average METs 4.25 4.02 4.02       Resistance Training   Training Prescription Yes Yes Yes     Weight 3 lbs 5 lbs 6 lbs     Reps 10-15 10-15 10-15       Interval Training   Interval Training No No Yes     Equipment  -  - REL-XR     Comments  -  - started 2 min and 30 sec        Treadmill   MPH  - 3.4 3.4     Grade  - 1 1     Minutes  - 15 15     METs  - 4.07 4.07       Elliptical   Level 1  -  -     Speed 3  -  -       REL-XR   Level _0 Minutes _1 METs 2.5 5.4 5.4       T5 Nustep   Level  - 6 6     Minutes  - 15 15     METs  - 2.6 3.4       Home Exercise Plan   Plans to continue exercise at  - Home  walk at home and at [place]  walk at home and at mall     Frequency  - Add 3 additional days to program exercise sessions. Add 3 additional days to program exercise sessions.        Exercise Comments:     Exercise Comments    Row Name 02/18/16 0849 02/20/16 1002 02/22/16 1000 02/22/16 1013 03/06/16 1153   Exercise Comments First full day of exercise!  Patient was oriented to gym and equipment including functions, settings, policies, and procedures.  Patient's individual exercise prescription and treatment plan were reviewed.  All starting workloads were established based on the results of the 6 minute walk  test done at initial orientation visit.  The plan for exercise progression was also introduced and progression will be customized based on patient's performance and goals. Geoffer said today that he feels so happy to be able to move again!! Reviewed METs average and discussed progression with pt today. Reviewed home exercise with pt today.  Pt plans to walk at the mall for exercise.  Reviewed THR, pulse, RPE, sign and symptoms, NTG use, and when to call 911 or MD.  Also discussed weather considerations and indoor options.  Pt voiced understanding. Jashan is doing great with exercise.  He has already started to lose weight!  He is already up over 5 METs on the XR. We will continue to monitor for progression.   [place] Name 03/07/16 [phone] 03/19/16 1625  Exercise Comments Reviewed METs average and discussed progression with pt today. Tyrese has been out since 8/25.  He had been doing well with exercise.  We will continue to monitor.         Discharge Exercise Prescription (Final Exercise Prescription Changes):     Exercise Prescription Changes - 03/19/16 1600      Exercise Review   Progression Yes     Response to Exercise   Blood Pressure (Admit) 126/64   Blood Pressure (Exercise) 156/80   Blood Pressure (Exit) 132/62   Heart Rate (Admit) 74 bpm   Heart Rate (Exercise) 114 bpm   Heart Rate (Exit) 69 bpm   Rating of Perceived Exertion (Exercise) 14   Symptoms none   Comments Home Exercise Guidelines given 02/22/16   Duration Progress to 45 minutes of aerobic exercise without signs/symptoms of physical distress   Intensity THRR unchanged     Progression   Progression Continue to progress workloads to maintain intensity without signs/symptoms of physical distress.   Average METs 4.02     Resistance Training   Training Prescription Yes   Weight 6 lbs   Reps 10-15     Interval Training   Interval Training Yes   Equipment REL-XR   Comments started 2 min and 30 sec       Treadmill   MPH 3.4   Grade 1   Minutes 15   METs 4.07     REL-XR   Level 5   Minutes 15   METs 5.4     T5 Nustep   Level 6   Minutes 15   METs 3.4     Home Exercise Plan   Plans to continue exercise at Home  walk at home and at mall   Frequency Add 3 additional days to program exercise sessions.      Nutrition:  Target Goals: Understanding of nutrition guidelines, daily intake of sodium <1598m, cholesterol <2019m calories 30% from fat and 7% or less from saturated fats, daily to have 5 or more servings of fruits and vegetables.  Biometrics:     Pre Biometrics - 02/13/16 1241      Pre Biometrics   Height _0  (1.778 m)   Weight 281 lb 9.6 oz (127.7 kg)   Waist Circumference 50.13 inches   Hip Circumference 58 inches   Waist to Hip Ratio 0.86 %   BMI (Calculated) 40.5   Single Leg Stand 30 seconds       Nutrition Therapy Plan and Nutrition Goals:     Nutrition Therapy & Goals - 02/19/16 1151      Nutrition Therapy   Diet Instructed on a 2000 calorie meal plan including heart healthy and diabetes dietary guidelines   Protein (specify units) 8   Fiber 30 grams   Whole Grain Foods 3 servings   Saturated Fats 13 max. grams   Fruits and Vegetables 5 servings/day  range of 5-8 servings daily   Sodium 2000 grams  1500 ideal     Personal Nutrition Goals   Personal Goal #1 Balance meals with protein, 2-4 servings of carbohydrate and non-starchy vegetables.   Personal Goal #2 Try low sodium products discussed such as StarKist very low sodium tuna, Tostado brand taco bowls, ViErit[place]ow sodium sauce   Personal Goal #3 Read labels for saturated fat, trans fat and sodium.   Comments Patient has made significant positive diet changes and overall is already following a heart healthy meal plan. Reviewed recommended servings of  carbohydrate on this calorie level.     Intervention Plan   Intervention Prescribe, educate and counsel regarding individualized specific  dietary modifications aiming towards targeted core components such as weight, hypertension, lipid management, diabetes, heart failure and other comorbidities.;Nutrition handout(s) given to patient.   Expected Outcomes Short Term Goal: Understand basic principles of dietary content, such as calories, fat, sodium, cholesterol and nutrients.;Short Term Goal: A plan has been developed with personal nutrition goals set during dietitian appointment.;Long Term Goal: Adherence to prescribed nutrition plan.      Nutrition Discharge: Rate Your Plate Scores:     Nutrition Assessments - 02/25/16 1534      Rate Your Plate Scores   Pre Score 68   Pre Score % 75.5 %      Nutrition Goals Re-Evaluation:   Psychosocial: Target Goals: Acknowledge presence or absence of depression, maximize coping skills, provide positive support system. Participant is able to verbalize types and ability to use techniques and skills needed for reducing stress and depression.  Initial Review & Psychosocial Screening:     Initial Psych Review & Screening - 02/13/16 1253      Initial Review   Current issues with History of Depression     Family Dynamics   Good Support System? Yes     Screening Interventions   Interventions Encouraged to exercise;Program counselor consult      Quality of Life Scores:     Quality of Life - 02/13/16 1319      Quality of Life Scores   Health/Function Pre 13.6 %   Socioeconomic Pre 16.07 %   Psych/Spiritual Pre 16 %   Family Pre 22.8 %   GLOBAL Pre 15.95 %      PHQ-9: Recent Review Flowsheet Data    Depression screen [facility] 2/9 02/13/2016   Decreased Interest 0   Down, Depressed, Hopeless 1   PHQ - 2 Score 1   Altered sleeping 0   Tired, decreased energy 0   Feeling bad or failure about yourself  3   Trouble concentrating 0   Moving slowly or fidgety/restless 0   Suicidal thoughts 0   PHQ-9 Score 4      Psychosocial Evaluation and Intervention:     Psychosocial  Evaluation - 02/20/16 1000      Psychosocial Evaluation & Interventions   Comments Follow with Mr. Diffley today stating he is already experiencing more energy since starting this class, and was excited to report he made a healthy dinner last night and slept well after the first class.  Counselor will continue to follow with Mr. Miklas.         Psychosocial Re-Evaluation:   Vocational Rehabilitation: Provide vocational rehab assistance to qualifying candidates.   Vocational Rehab Evaluation & Intervention:     Vocational Rehab - 02/13/16 1250      Initial Vocational Rehab Evaluation & Intervention   Assessment shows need for Vocational Rehabilitation No      Education: Education Goals: Education classes will be provided on a weekly basis, covering required topics. Participant will state understanding/return demonstration of topics presented.  Learning Barriers/Preferences:     Learning Barriers/Preferences - 02/13/16 1249      Learning Barriers/Preferences   Learning Barriers None   Learning Preferences Verbal Instruction;Individual Instruction      Education Topics: General Nutrition Guidelines/Fats and Fiber: -Group instruction provided by verbal, written material, models and posters to present the general guidelines for heart healthy nutrition. Gives an explanation and review of dietary fats  and fiber.   Controlling Sodium/Reading Food Labels: -Group verbal and written material supporting the discussion of sodium use in heart healthy nutrition. Review and explanation with models, verbal and written materials for utilization of the food label. Flowsheet Row Cardiac Rehab from 02/27/2016 in [facility] Cardiac and Pulmonary Rehab  Date  02/18/16  Educator  CR  Instruction Review Code  2- meets goals/outcomes      Exercise Physiology & Risk Factors: - Group verbal and written instruction with models to review the exercise physiology of the cardiovascular system and  associated critical values. Details cardiovascular disease risk factors and the goals associated with each risk factor.   Aerobic Exercise & Resistance Training: - Gives group verbal and written discussion on the health impact of inactivity. On the components of aerobic and resistive training programs and the benefits of this training and how to safely progress through these programs. Flowsheet Row Cardiac Rehab from 02/27/2016 in [facility] Cardiac and Pulmonary Rehab  Date  02/20/16  Educator  [facility]  Instruction Review Code  2- meets goals/outcomes      Flexibility, Balance, General Exercise Guidelines: - Provides group verbal and written instruction on the benefits of flexibility and balance training programs. Provides general exercise guidelines with specific guidelines to those with heart or lung disease. Demonstration and skill practice provided. Flowsheet Row Cardiac Rehab from 02/27/2016 in [facility] Cardiac and Pulmonary Rehab  Date  02/25/16  Educator  [facility]  Instruction Review Code  2- meets goals/outcomes      Stress Management: - Provides group verbal and written instruction about the health risks of elevated stress, cause of high stress, and healthy ways to reduce stress. Flowsheet Row Cardiac Rehab from 02/27/2016 in [facility] Cardiac and Pulmonary Rehab  Date  02/27/16  Educator  [facility]  Instruction Review Code  2- meets goals/outcomes      Depression: - Provides group verbal and written instruction on the correlation between heart/lung disease and depressed mood, treatment options, and the stigmas associated with seeking treatment.   Anatomy & Physiology of the Heart: - Group verbal and written instruction and models provide basic cardiac anatomy and physiology, with the coronary electrical and arterial systems. Review of: AMI, Angina, Valve disease, Heart Failure, Cardiac Arrhythmia, Pacemakers, and the ICD.   Cardiac Procedures: - Group verbal and written instruction and models to  describe the testing methods done to diagnose heart disease. Reviews the outcomes of the test results. Describes the treatment choices: Medical Management, Angioplasty, or Coronary Bypass Surgery.   Cardiac Medications: - Group verbal and written instruction to review commonly prescribed medications for heart disease. Reviews the medication, class of the drug, and side effects. Includes the steps to properly store meds and maintain the prescription regimen.   Go Sex-Intimacy & Heart Disease, Get SMART - Goal Setting: - Group verbal and written instruction through game format to discuss heart disease and the return to sexual intimacy. Provides group verbal and written material to discuss and apply goal setting through the application of the S.M.A.R.T. Method.   Other Matters of the Heart: - Provides group verbal, written materials and models to describe Heart Failure, Angina, Valve Disease, and Diabetes in the realm of heart disease. Includes description of the disease process and treatment options available to the cardiac patient.   Exercise & Equipment Safety: - Individual verbal instruction and demonstration of equipment use and safety with use of the equipment. Flowsheet Row Cardiac Rehab from 02/27/2016 in [facility] Cardiac and Pulmonary Rehab  Date  02/13/16  Educator  C. EnterkinRN  Instruction Review Code  1- partially meets, needs review/practice      Infection Prevention: - Provides verbal and written material to individual with discussion of infection control including proper hand washing and proper equipment cleaning during exercise session. Flowsheet Row Cardiac Rehab from 02/27/2016 in [facility] Cardiac and Pulmonary Rehab  Date  02/13/16  Educator  C. Enterkin,RN  Instruction Review Code  2- meets goals/outcomes      Falls Prevention: - Provides verbal and written material to individual with discussion of falls prevention and safety. Flowsheet Row Cardiac Rehab from 02/27/2016 in  [facility] Cardiac and Pulmonary Rehab  Date  02/13/16  Educator  C. [place]  Instruction Review Code  2- meets goals/outcomes      Diabetes: - Individual verbal and written instruction to review signs/symptoms of diabetes, desired ranges of glucose level fasting, after meals and with exercise. Advice that pre and post exercise glucose checks will be done for 3 sessions at entry of program. [place] from 02/27/2016 in [facility] Cardiac and Pulmonary Rehab  Date  02/13/16  Educator  C. EnterkinRN  Instruction Review Code  2- meets goals/outcomes       Knowledge Questionnaire Score:     Knowledge Questionnaire Score - 02/13/16 1320      Knowledge Questionnaire Score   Pre Score 27      Core Components/Risk Factors/Patient Goals at Admission:     Personal Goals and Risk Factors at Admission - 02/13/16 1252      Core Components/Risk Factors/Patient Goals on Admission    Weight Management Obesity;Weight Maintenance;Weight Loss   Increase Strength and Stamina Yes   Intervention Provide advice, education, support and counseling about physical activity/exercise needs.;Develop an individualized exercise prescription for aerobic and resistive training based on initial evaluation findings, risk stratification, comorbidities and participant's personal goals.   Expected Outcomes Achievement of increased cardiorespiratory fitness and enhanced flexibility, muscular endurance and strength shown through measurements of functional capacity and personal statement of participant.   Diabetes Yes   Intervention Provide education about signs/symptoms and action to take for hypo/hyperglycemia.;Provide education about proper nutrition, including hydration, and aerobic/resistive exercise prescription along with prescribed medications to achieve blood glucose in normal ranges: Fasting glucose 65-99 mg/dL   Expected Outcomes Short Term: Participant verbalizes understanding of the signs/symptoms  and immediate care of hyper/hypoglycemia, proper foot care and importance of medication, aerobic/resistive exercise and nutrition plan for blood glucose control.;Long Term: Attainment of HbA1C < 7%.   Hypertension Yes   Intervention Provide education on lifestyle modifcations including regular physical activity/exercise, weight management, moderate sodium restriction and increased consumption of fresh fruit, vegetables, and low fat dairy, alcohol moderation, and smoking cessation.;Monitor prescription use compliance.   Expected Outcomes Short Term: Continued assessment and intervention until BP is < 140/11m HG in hypertensive participants. < 130/88mHG in hypertensive participants with diabetes, heart failure or chronic kidney disease.;Long Term: Maintenance of blood pressure at goal levels.   Lipids Yes   Intervention Provide education and support for participant on nutrition & aerobic/resistive exercise along with prescribed medications to achieve LDL <7043mHDL >30m81m Expected Outcomes Short Term: Participant states understanding of desired cholesterol values and is compliant with medications prescribed. Participant is following exercise prescription and nutrition guidelines.;Long Term: Cholesterol controlled with medications as prescribed, with individualized exercise RX and with personalized nutrition plan. Value goals: LDL < 70mg68mL > 40 mg.      Core Components/Risk Factors/Patient Goals Review:  Goals and Risk Factor Review    Row Name 02/20/16 1002 03/05/16 0957           Core Components/Risk Factors/Patient Goals Review   Personal Goals Review Weight Management/Obesity;Sedentary;Increase Strength and Stamina;Diabetes;Lipids;Hypertension Weight Management/Obesity;Sedentary;Increase Strength and Stamina;Stress;Hypertension;Diabetes;Lipids      Review Abimael is on day two today with exercise.  He is already feeling better, stating that it feels so good to be moving again.  We  reviewed his goals and risk factors that we will be focusing on during rehab.  He has already met with the dietician and has started to make changes to his diet. Giovonnie has been walking at home and coming to class.  He says that he is feeling good and losing 1 lb a week. His blood pressures are doing well, and he is checking them at home with a new wrist cuff.  He has not had any problems with his statins.  His blood sugars are doing well since changing to glipuride.  He is stressed about waiting for work.  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      Expected Outcomes Kobie will continue to come to education and exercise classes to learn more about his risk factors and improve his health.  We will continue to monitor his progress. Norton will continue to come to classes for exercise and education.  We will continue to monitor him during the program.         Core Components/Risk Factors/Patient Goals at Discharge (Final Review):      Goals and Risk Factor Review - 03/05/16 0957      Core Components/Risk Factors/Patient Goals Review   Personal Goals Review Weight Management/Obesity;Sedentary;Increase Strength and Stamina;Stress;Hypertension;Diabetes;Lipids   Review Edel has been walking at home and coming to class.  He says that he is feeling good and losing 1 lb a week. His blood pressures are doing well, and he is checking them at home with a new wrist cuff.  He has not had any problems with his statins.  His blood sugars are doing well since changing to glipuride.  He is stressed about waiting for work.  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   Expected Outcomes Elhadji will continue to come to classes for exercise and education.  We will continue to monitor him during the program.      ITP Comments:     ITP Comments    Row Name 02/13/16 1252 02/27/16 0747 03/19/16 1626 03/26/16 0628     ITP Comments REady to start Cardiac REhab. He is tired of sitting at  home waiting to get clearance to go back to work.  30 day review. Continue with ITP unless changes noted by Medical Director at signature of review. Jaxsyn has been out since 8/25.  LM on VM to check on status. 30 day review. Continue with ITP unless changes noted by Medical Director at signature of review.       Comments:

## 2016-03-27 DIAGNOSIS — E7800 Pure hypercholesterolemia, unspecified: Secondary | ICD-10-CM | POA: Diagnosis not present

## 2016-03-27 DIAGNOSIS — R5383 Other fatigue: Secondary | ICD-10-CM | POA: Diagnosis not present

## 2016-03-27 DIAGNOSIS — E559 Vitamin D deficiency, unspecified: Secondary | ICD-10-CM | POA: Diagnosis not present

## 2016-03-27 DIAGNOSIS — E119 Type 2 diabetes mellitus without complications: Secondary | ICD-10-CM | POA: Diagnosis not present

## 2016-03-27 DIAGNOSIS — I10 Essential (primary) hypertension: Secondary | ICD-10-CM | POA: Diagnosis not present

## 2016-03-27 DIAGNOSIS — N390 Urinary tract infection, site not specified: Secondary | ICD-10-CM | POA: Diagnosis not present

## 2016-03-27 DIAGNOSIS — E785 Hyperlipidemia, unspecified: Secondary | ICD-10-CM | POA: Diagnosis not present

## 2016-04-03 ENCOUNTER — Telehealth: Payer: Self-pay | Admitting: *Deleted

## 2016-04-03 ENCOUNTER — Encounter: Payer: Self-pay | Admitting: *Deleted

## 2016-04-03 DIAGNOSIS — Z955 Presence of coronary angioplasty implant and graft: Secondary | ICD-10-CM

## 2016-04-03 NOTE — Telephone Encounter (Signed)
Called to check on status of return to program.  He continues to be called into work.  He wants to continue and hopes to be back soon.

## 2016-04-14 ENCOUNTER — Encounter: Payer: 59 | Attending: Cardiology

## 2016-04-14 DIAGNOSIS — Z955 Presence of coronary angioplasty implant and graft: Secondary | ICD-10-CM | POA: Insufficient documentation

## 2016-04-16 ENCOUNTER — Encounter: Payer: Self-pay | Admitting: *Deleted

## 2016-04-16 DIAGNOSIS — Z955 Presence of coronary angioplasty implant and graft: Secondary | ICD-10-CM

## 2016-04-23 ENCOUNTER — Encounter: Payer: Self-pay | Admitting: *Deleted

## 2016-04-23 DIAGNOSIS — Z955 Presence of coronary angioplasty implant and graft: Secondary | ICD-10-CM

## 2016-04-23 NOTE — Progress Notes (Signed)
Cardiac Individual Treatment Plan  Patient Details  Name: Adam Patterson MRN: [id number] Date of Birth: 17-Dec-1960 Referring Provider:   Flowsheet Row Cardiac Rehab from 02/13/2016 in [facility] Cardiac and Pulmonary Rehab  Referring Provider  Einar Gip      Initial Encounter Date:  Flowsheet Row Cardiac Rehab from 02/13/2016 in [facility] Cardiac and Pulmonary Rehab  Date  02/13/16  Referring Provider  Einar Gip      Visit Diagnosis: S/P coronary artery stent placement  Patient's Home Medications on Admission:  Current Outpatient Prescriptions:  .  amLODipine-valsartan (EXFORGE) 10-320 MG tablet, Take 0.5 tablets by mouth daily., Disp: 30 tablet, Rfl: 3 .  aspirin 81 MG tablet, Take 81 mg by mouth daily.  , Disp: , Rfl:  .  carvedilol (COREG) 3.125 MG tablet, Take 1 tablet (3.125 mg total) by mouth 2 (two) times daily with a meal., Disp: 60 tablet, Rfl: 0 .  Cholecalciferol (VITAMIN D3) 2000 UNITS TABS, Take 1 tablet by mouth daily.  , Disp: , Rfl:  .  DULoxetine (CYMBALTA) 60 MG capsule, Take 1 capsule (60 mg total) by mouth daily., Disp: 30 capsule, Rfl: 3 .  fish oil-omega-3 fatty acids 1000 MG capsule, Take 2 g by mouth 2 (two) times daily.  , Disp: , Rfl:  .  FLUoxetine (PROZAC) 20 MG tablet, Take 1 tablet by mouth daily., Disp: , Rfl: 3 .  glimepiride (AMARYL) 4 MG tablet, Take 4 mg by mouth daily with breakfast., Disp: , Rfl:  .  Insulin Pen Needle (NOVOFINE) 30G X 8 MM MISC, Patient test blood sugar two times a day. (Patient not taking: Reported on 02/13/2016), Disp: 100 each, Rfl: 6 .  metFORMIN (GLUCOPHAGE) 1000 MG tablet, Take 1 tablet (1,000 mg total) by mouth 2 (two) times daily., Disp: , Rfl: 3 .  Multiple Vitamin (MULTIVITAMIN PO), Take 1 tablet by mouth daily.  , Disp: , Rfl:  .  mupirocin ointment (BACTROBAN) 2 %, Place 1 application into the nose 2 (two) times daily., Disp: 22 g, Rfl: 0 .  nitroGLYCERIN (NITROSTAT) 0.4 MG SL tablet, Place 1 tablet (0.4 mg total) under the tongue every  5 (five) minutes x 3 doses as needed for chest pain., Disp: 25 tablet, Rfl: 1 .  rosuvastatin (CRESTOR) 40 MG tablet, Take 1 tablet by mouth daily., Disp: , Rfl: 3 .  ticagrelor (BRILINTA) 90 MG TABS tablet, Take 1 tablet (90 mg total) by mouth 2 (two) times daily., Disp: 60 tablet, Rfl: 11  Past Medical History: Past Medical History:  Diagnosis Date  . Abnormal liver enzymes 2006   no prior workup  . Diabetes mellitus   . Hyperlipidemia   . Hypertension   . Myocardial infarction   . Obesity   . Psoriasis    affecting hands (skin)  . Sleep apnea sleep study 2007   obstructive, not using mask    Tobacco Use: History  Smoking Status  . Former Smoker  . Types: Pipe  . Quit date: 03/30/2010  Smokeless Tobacco  . Never Used    Labs: Recent Review Flowsheet Data    Labs for ITP Cardiac and Pulmonary Rehab Latest Ref Rng & Units 02/04/2016 02/05/2016   Cholestrol 0 - 200 mg/dL 113 -   LDLCALC 0 - 99 mg/dL 49 -   HDL >40 mg/dL 54 -   Trlycerides <150 mg/dL 48 -   Hemoglobin A1c 4.8 - 5.6 % - 6.5(H)       Exercise Target Goals:    Exercise  Program Goal: Individual exercise prescription set with THRR, safety & activity barriers. Participant demonstrates ability to understand and report RPE using BORG scale, to self-measure pulse accurately, and to acknowledge the importance of the exercise prescription.  Exercise Prescription Goal: Starting with aerobic activity 30 plus minutes a day, 3 days per week for initial exercise prescription. Provide home exercise prescription and guidelines that participant acknowledges understanding prior to discharge.  Activity Barriers & Risk Stratification:     Activity Barriers & Cardiac Risk Stratification - 02/13/16 1249      Activity Barriers & Cardiac Risk Stratification   Activity Barriers None   Cardiac Risk Stratification High      6 Minute Walk:     6 Minute Walk    Row Name 02/13/16 1242         6 Minute Walk    Distance 1645 feet     Walk Time 6 minutes     MPH 3.1     METS 3.81     RPE 11     VO2 Peak 13.4     Symptoms No     Resting HR 65 bpm     Resting BP 152/80     Max Ex. HR 104 bpm     Max Ex. BP 168/78        Initial Exercise Prescription:     Initial Exercise Prescription - 02/13/16 1200      Date of Initial Exercise RX and Referring Provider   Date 02/13/16   Referring Provider Ganji     Treadmill   MPH 3.4   Grade 0.5   Minutes 15   METs 3.84     Recumbant Bike   Level 3   RPM 60   Minutes 15   METs 3.8     Elliptical   Level 1   Speed 3     REL-XR   Level 3   Minutes 15   METs 3.8     T5 Nustep   Level 4   Minutes 15   METs 3.8     Prescription Details   Frequency (times per week) 3   Duration Progress to 45 minutes of aerobic exercise without signs/symptoms of physical distress     Intensity   THRR 40-80% of Max Heartrate 104-144   Ratings of Perceived Exertion 11-13   Perceived Dyspnea 0-4     Progression   Progression Continue to progress workloads to maintain intensity without signs/symptoms of physical distress.     Resistance Training   Training Prescription Yes   Weight 3   Reps 10-15      Perform Capillary Blood Glucose checks as needed.  Exercise Prescription Changes:     Exercise Prescription Changes    Row Name 02/19/16 1500 03/06/16 1100 03/19/16 1600         Exercise Review   Progression -  First full day of exercise Yes Yes       Response to Exercise   Blood Pressure (Admit) 130/70 116/64 126/64     Blood Pressure (Exercise) 140/88 136/74 156/80     Blood Pressure (Exit) 124/62 120/82 132/62     Heart Rate (Admit) 57 bpm 105 bpm 74 bpm     Heart Rate (Exercise) 93 bpm 109 bpm 114 bpm     Heart Rate (Exit) 51 bpm 75 bpm 69 bpm     Rating of Perceived Exertion (Exercise) _0 Symptoms none none none  Comments  - Home Exercise Guidelines given 02/22/16 Home Exercise Guidelines given 02/22/16      Duration Progress to 45 minutes of aerobic exercise without signs/symptoms of physical distress Progress to 45 minutes of aerobic exercise without signs/symptoms of physical distress Progress to 45 minutes of aerobic exercise without signs/symptoms of physical distress     Intensity THRR unchanged THRR unchanged THRR unchanged       Progression   Progression Continue to progress workloads to maintain intensity without signs/symptoms of physical distress. Continue to progress workloads to maintain intensity without signs/symptoms of physical distress. Continue to progress workloads to maintain intensity without signs/symptoms of physical distress.     Average METs 4.25 4.02 4.02       Resistance Training   Training Prescription Yes Yes Yes     Weight 3 lbs 5 lbs 6 lbs     Reps 10-15 10-15 10-15       Interval Training   Interval Training No No Yes     Equipment  -  - REL-XR     Comments  -  - started 2 min and 30 sec        Treadmill   MPH  - 3.4 3.4     Grade  - 1 1     Minutes  - 15 15     METs  - 4.07 4.07       Elliptical   Level 1  -  -     Speed 3  -  -       REL-XR   Level _0 Minutes _1 METs 2.5 5.4 5.4       T5 Nustep   Level  - 6 6     Minutes  - 15 15     METs  - 2.6 3.4       Home Exercise Plan   Plans to continue exercise at  - Home  walk at home and at [place]  walk at home and at mall     Frequency  - Add 3 additional days to program exercise sessions. Add 3 additional days to program exercise sessions.        Exercise Comments:     Exercise Comments    Row Name 02/18/16 0849 02/20/16 1002 02/22/16 1000 02/22/16 1013 03/06/16 1153   Exercise Comments First full day of exercise!  Patient was oriented to gym and equipment including functions, settings, policies, and procedures.  Patient's individual exercise prescription and treatment plan were reviewed.  All starting workloads were established based on the results of the 6 minute walk  test done at initial orientation visit.  The plan for exercise progression was also introduced and progression will be customized based on patient's performance and goals. Geoffer said today that he feels so happy to be able to move again!! Reviewed METs average and discussed progression with pt today. Reviewed home exercise with pt today.  Pt plans to walk at the mall for exercise.  Reviewed THR, pulse, RPE, sign and symptoms, NTG use, and when to call 911 or MD.  Also discussed weather considerations and indoor options.  Pt voiced understanding. Daneil is doing great with exercise.  He has already started to lose weight!  He is already up over 5 METs on the XR. We will continue to monitor for progression.   [place] Name 03/07/16 [phone] 03/19/16 1625 04/16/16 1446  Exercise Comments Reviewed METs average and discussed progression with pt today. Morell has been out since 8/25.  He had been doing well with exercise.  We will continue to monitor. Has been out since last review due to work.        Discharge Exercise Prescription (Final Exercise Prescription Changes):     Exercise Prescription Changes - 03/19/16 1600      Exercise Review   Progression Yes     Response to Exercise   Blood Pressure (Admit) 126/64   Blood Pressure (Exercise) 156/80   Blood Pressure (Exit) 132/62   Heart Rate (Admit) 74 bpm   Heart Rate (Exercise) 114 bpm   Heart Rate (Exit) 69 bpm   Rating of Perceived Exertion (Exercise) 14   Symptoms none   Comments Home Exercise Guidelines given 02/22/16   Duration Progress to 45 minutes of aerobic exercise without signs/symptoms of physical distress   Intensity THRR unchanged     Progression   Progression Continue to progress workloads to maintain intensity without signs/symptoms of physical distress.   Average METs 4.02     Resistance Training   Training Prescription Yes   Weight 6 lbs   Reps 10-15     Interval Training   Interval Training Yes   Equipment  REL-XR   Comments started 2 min and 30 sec      Treadmill   MPH 3.4   Grade 1   Minutes 15   METs 4.07     REL-XR   Level 5   Minutes 15   METs 5.4     T5 Nustep   Level 6   Minutes 15   METs 3.4     Home Exercise Plan   Plans to continue exercise at Home  walk at home and at mall   Frequency Add 3 additional days to program exercise sessions.      Nutrition:  Target Goals: Understanding of nutrition guidelines, daily intake of sodium <1544m, cholesterol <2058m calories 30% from fat and 7% or less from saturated fats, daily to have 5 or more servings of fruits and vegetables.  Biometrics:     Pre Biometrics - 02/13/16 1241      Pre Biometrics   Height _0  (1.778 m)   Weight 281 lb 9.6 oz (127.7 kg)   Waist Circumference 50.13 inches   Hip Circumference 58 inches   Waist to Hip Ratio 0.86 %   BMI (Calculated) 40.5   Single Leg Stand 30 seconds       Nutrition Therapy Plan and Nutrition Goals:     Nutrition Therapy & Goals - 02/19/16 1151      Nutrition Therapy   Diet Instructed on a 2000 calorie meal plan including heart healthy and diabetes dietary guidelines   Protein (specify units) 8   Fiber 30 grams   Whole Grain Foods 3 servings   Saturated Fats 13 max. grams   Fruits and Vegetables 5 servings/day  range of 5-8 servings daily   Sodium 2000 grams  1500 ideal     Personal Nutrition Goals   Personal Goal #1 Balance meals with protein, 2-4 servings of carbohydrate and non-starchy vegetables.   Personal Goal #2 Try low sodium products discussed such as StarKist very low sodium tuna, Tostado brand taco bowls, Vi[place]rit[place]ow sodium sauce   Personal Goal #3 Read labels for saturated fat, trans fat and sodium.   Comments Patient has made significant positive diet changes and overall is already following a  heart healthy meal plan. Reviewed recommended servings of carbohydrate on this calorie level.     Intervention Plan   Intervention Prescribe,  educate and counsel regarding individualized specific dietary modifications aiming towards targeted core components such as weight, hypertension, lipid management, diabetes, heart failure and other comorbidities.;Nutrition handout(s) given to patient.   Expected Outcomes Short Term Goal: Understand basic principles of dietary content, such as calories, fat, sodium, cholesterol and nutrients.;Short Term Goal: A plan has been developed with personal nutrition goals set during dietitian appointment.;Long Term Goal: Adherence to prescribed nutrition plan.      Nutrition Discharge: Rate Your Plate Scores:     Nutrition Assessments - 02/25/16 1534      Rate Your Plate Scores   Pre Score 68   Pre Score % 75.5 %      Nutrition Goals Re-Evaluation:   Psychosocial: Target Goals: Acknowledge presence or absence of depression, maximize coping skills, provide positive support system. Participant is able to verbalize types and ability to use techniques and skills needed for reducing stress and depression.  Initial Review & Psychosocial Screening:     Initial Psych Review & Screening - 02/13/16 1253      Initial Review   Current issues with History of Depression     Family Dynamics   Good Support System? Yes     Screening Interventions   Interventions Encouraged to exercise;Program counselor consult      Quality of Life Scores:     Quality of Life - 02/13/16 1319      Quality of Life Scores   Health/Function Pre 13.6 %   Socioeconomic Pre 16.07 %   Psych/Spiritual Pre 16 %   Family Pre 22.8 %   GLOBAL Pre 15.95 %      PHQ-9: Recent Review Flowsheet Data    Depression screen [facility] 2/9 02/13/2016   Decreased Interest 0   Down, Depressed, Hopeless 1   PHQ - 2 Score 1   Altered sleeping 0   Tired, decreased energy 0   Feeling bad or failure about yourself  3   Trouble concentrating 0   Moving slowly or fidgety/restless 0   Suicidal thoughts 0   PHQ-9 Score 4       Psychosocial Evaluation and Intervention:     Psychosocial Evaluation - 02/20/16 1000      Psychosocial Evaluation & Interventions   Comments Follow with Mr. Heinicke today stating he is already experiencing more energy since starting this class, and was excited to report he made a healthy dinner last night and slept well after the first class.  Counselor will continue to follow with Mr. Mcgue.         Psychosocial Re-Evaluation:   Vocational Rehabilitation: Provide vocational rehab assistance to qualifying candidates.   Vocational Rehab Evaluation & Intervention:     Vocational Rehab - 02/13/16 1250      Initial Vocational Rehab Evaluation & Intervention   Assessment shows need for Vocational Rehabilitation No      Education: Education Goals: Education classes will be provided on a weekly basis, covering required topics. Participant will state understanding/return demonstration of topics presented.  Learning Barriers/Preferences:     Learning Barriers/Preferences - 02/13/16 1249      Learning Barriers/Preferences   Learning Barriers None   Learning Preferences Verbal Instruction;Individual Instruction      Education Topics: General Nutrition Guidelines/Fats and Fiber: -Group instruction provided by verbal, written material, models and posters to present the general guidelines for heart healthy nutrition.  Gives an explanation and review of dietary fats and fiber.   Controlling Sodium/Reading Food Labels: -Group verbal and written material supporting the discussion of sodium use in heart healthy nutrition. Review and explanation with models, verbal and written materials for utilization of the food label. Flowsheet Row Cardiac Rehab from 02/27/2016 in [facility] Cardiac and Pulmonary Rehab  Date  02/18/16  Educator  CR  Instruction Review Code  2- meets goals/outcomes      Exercise Physiology & Risk Factors: - Group verbal and written instruction with models to  review the exercise physiology of the cardiovascular system and associated critical values. Details cardiovascular disease risk factors and the goals associated with each risk factor.   Aerobic Exercise & Resistance Training: - Gives group verbal and written discussion on the health impact of inactivity. On the components of aerobic and resistive training programs and the benefits of this training and how to safely progress through these programs. Flowsheet Row Cardiac Rehab from 02/27/2016 in [facility] Cardiac and Pulmonary Rehab  Date  02/20/16  Educator  [facility]  Instruction Review Code  2- meets goals/outcomes      Flexibility, Balance, General Exercise Guidelines: - Provides group verbal and written instruction on the benefits of flexibility and balance training programs. Provides general exercise guidelines with specific guidelines to those with heart or lung disease. Demonstration and skill practice provided. Flowsheet Row Cardiac Rehab from 02/27/2016 in [facility] Cardiac and Pulmonary Rehab  Date  02/25/16  Educator  [facility]  Instruction Review Code  2- meets goals/outcomes      Stress Management: - Provides group verbal and written instruction about the health risks of elevated stress, cause of high stress, and healthy ways to reduce stress. Flowsheet Row Cardiac Rehab from 02/27/2016 in [facility] Cardiac and Pulmonary Rehab  Date  02/27/16  Educator  [facility]  Instruction Review Code  2- meets goals/outcomes      Depression: - Provides group verbal and written instruction on the correlation between heart/lung disease and depressed mood, treatment options, and the stigmas associated with seeking treatment.   Anatomy & Physiology of the Heart: - Group verbal and written instruction and models provide basic cardiac anatomy and physiology, with the coronary electrical and arterial systems. Review of: AMI, Angina, Valve disease, Heart Failure, Cardiac Arrhythmia, Pacemakers, and the ICD.   Cardiac  Procedures: - Group verbal and written instruction and models to describe the testing methods done to diagnose heart disease. Reviews the outcomes of the test results. Describes the treatment choices: Medical Management, Angioplasty, or Coronary Bypass Surgery.   Cardiac Medications: - Group verbal and written instruction to review commonly prescribed medications for heart disease. Reviews the medication, class of the drug, and side effects. Includes the steps to properly store meds and maintain the prescription regimen.   Go Sex-Intimacy & Heart Disease, Get SMART - Goal Setting: - Group verbal and written instruction through game format to discuss heart disease and the return to sexual intimacy. Provides group verbal and written material to discuss and apply goal setting through the application of the S.M.A.R.T. Method.   Other Matters of the Heart: - Provides group verbal, written materials and models to describe Heart Failure, Angina, Valve Disease, and Diabetes in the realm of heart disease. Includes description of the disease process and treatment options available to the cardiac patient.   Exercise & Equipment Safety: - Individual verbal instruction and demonstration of equipment use and safety with use of the equipment. Flowsheet Row Cardiac Rehab from 02/27/2016 in  [place] Cardiac and Pulmonary Rehab  Date  02/13/16  Educator  C. EnterkinRN  Instruction Review Code  1- partially meets, needs review/practice      Infection Prevention: - Provides verbal and written material to individual with discussion of infection control including proper hand washing and proper equipment cleaning during exercise session. Flowsheet Row Cardiac Rehab from 02/27/2016 in [facility] Cardiac and Pulmonary Rehab  Date  02/13/16  Educator  C. Enterkin,RN  Instruction Review Code  2- meets goals/outcomes      Falls Prevention: - Provides verbal and written material to individual with discussion of falls  prevention and safety. Flowsheet Row Cardiac Rehab from 02/27/2016 in [facility] Cardiac and Pulmonary Rehab  Date  02/13/16  Educator  C. [place]  Instruction Review Code  2- meets goals/outcomes      Diabetes: - Individual verbal and written instruction to review signs/symptoms of diabetes, desired ranges of glucose level fasting, after meals and with exercise. Advice that pre and post exercise glucose checks will be done for 3 sessions at entry of program. [place] from 02/27/2016 in [facility] Cardiac and Pulmonary Rehab  Date  02/13/16  Educator  C. EnterkinRN  Instruction Review Code  2- meets goals/outcomes       Knowledge Questionnaire Score:     Knowledge Questionnaire Score - 02/13/16 1320      Knowledge Questionnaire Score   Pre Score 27      Core Components/Risk Factors/Patient Goals at Admission:     Personal Goals and Risk Factors at Admission - 02/13/16 1252      Core Components/Risk Factors/Patient Goals on Admission    Weight Management Obesity;Weight Maintenance;Weight Loss   Increase Strength and Stamina Yes   Intervention Provide advice, education, support and counseling about physical activity/exercise needs.;Develop an individualized exercise prescription for aerobic and resistive training based on initial evaluation findings, risk stratification, comorbidities and participant's personal goals.   Expected Outcomes Achievement of increased cardiorespiratory fitness and enhanced flexibility, muscular endurance and strength shown through measurements of functional capacity and personal statement of participant.   Diabetes Yes   Intervention Provide education about signs/symptoms and action to take for hypo/hyperglycemia.;Provide education about proper nutrition, including hydration, and aerobic/resistive exercise prescription along with prescribed medications to achieve blood glucose in normal ranges: Fasting glucose 65-99 mg/dL   Expected Outcomes  Short Term: Participant verbalizes understanding of the signs/symptoms and immediate care of hyper/hypoglycemia, proper foot care and importance of medication, aerobic/resistive exercise and nutrition plan for blood glucose control.;Long Term: Attainment of HbA1C < 7%.   Hypertension Yes   Intervention Provide education on lifestyle modifcations including regular physical activity/exercise, weight management, moderate sodium restriction and increased consumption of fresh fruit, vegetables, and low fat dairy, alcohol moderation, and smoking cessation.;Monitor prescription use compliance.   Expected Outcomes Short Term: Continued assessment and intervention until BP is < 140/79m HG in hypertensive participants. < 130/81mHG in hypertensive participants with diabetes, heart failure or chronic kidney disease.;Long Term: Maintenance of blood pressure at goal levels.   Lipids Yes   Intervention Provide education and support for participant on nutrition & aerobic/resistive exercise along with prescribed medications to achieve LDL <7076mHDL >36m80m Expected Outcomes Short Term: Participant states understanding of desired cholesterol values and is compliant with medications prescribed. Participant is following exercise prescription and nutrition guidelines.;Long Term: Cholesterol controlled with medications as prescribed, with individualized exercise RX and with personalized nutrition plan. Value goals: LDL < 70mg61mL > 40 mg.  Core Components/Risk Factors/Patient Goals Review:      Goals and Risk Factor Review    Row Name 02/20/16 1002 03/05/16 0957           Core Components/Risk Factors/Patient Goals Review   Personal Goals Review Weight Management/Obesity;Sedentary;Increase Strength and Stamina;Diabetes;Lipids;Hypertension Weight Management/Obesity;Sedentary;Increase Strength and Stamina;Stress;Hypertension;Diabetes;Lipids      Review Bronsen is on day two today with exercise.  He is already  feeling better, stating that it feels so good to be moving again.  We reviewed his goals and risk factors that we will be focusing on during rehab.  He has already met with the dietician and has started to make changes to his diet. Dawsen has been walking at home and coming to class.  He says that he is feeling good and losing 1 lb a week. His blood pressures are doing well, and he is checking them at home with a new wrist cuff.  He has not had any problems with his statins.  His blood sugars are doing well since changing to glipuride.  He is stressed about waiting for work.  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      Expected Outcomes Leonidas will continue to come to education and exercise classes to learn more about his risk factors and improve his health.  We will continue to monitor his progress. Trenden will continue to come to classes for exercise and education.  We will continue to monitor him during the program.         Core Components/Risk Factors/Patient Goals at Discharge (Final Review):      Goals and Risk Factor Review - 03/05/16 0957      Core Components/Risk Factors/Patient Goals Review   Personal Goals Review Weight Management/Obesity;Sedentary;Increase Strength and Stamina;Stress;Hypertension;Diabetes;Lipids   Review Raistlin has been walking at home and coming to class.  He says that he is feeling good and losing 1 lb a week. His blood pressures are doing well, and he is checking them at home with a new wrist cuff.  He has not had any problems with his statins.  His blood sugars are doing well since changing to glipuride.  He is stressed about waiting for work.  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   Expected Outcomes Nikholas will continue to come to classes for exercise and education.  We will continue to monitor him during the program.      ITP Comments:     ITP Comments    Row Name 02/13/16 1252 02/27/16 0747 03/19/16 1626 03/26/16 0628  04/03/16 1537   ITP Comments REady to start Cardiac REhab. He is tired of sitting at home waiting to get clearance to go back to work.  30 day review. Continue with ITP unless changes noted by Medical Director at signature of review. Gabreil has been out since 8/25.  LM on VM to check on status. 30 day review. Continue with ITP unless changes noted by Medical Director at signature of review. Called to check on status of return to program.  He continues to be called into work.  He wants to continue and hopes to be back soon.   [place] Name 04/16/16 1446 04/23/16 1130         ITP Comments Has been out since last review due to work. 30 day review. Continue with ITP unless changes noted by Medical Director at signature of review.         Comments:

## 2016-04-30 ENCOUNTER — Telehealth: Payer: Self-pay | Admitting: *Deleted

## 2016-04-30 ENCOUNTER — Encounter: Payer: Self-pay | Admitting: *Deleted

## 2016-04-30 DIAGNOSIS — Z955 Presence of coronary angioplasty implant and graft: Secondary | ICD-10-CM

## 2016-04-30 NOTE — Telephone Encounter (Signed)
Called to check on status of return.  LMOM

## 2016-05-14 ENCOUNTER — Encounter: Payer: 59 | Attending: Cardiology

## 2016-05-14 DIAGNOSIS — Z955 Presence of coronary angioplasty implant and graft: Secondary | ICD-10-CM | POA: Insufficient documentation

## 2016-05-15 ENCOUNTER — Encounter: Payer: Self-pay | Admitting: *Deleted

## 2016-05-15 DIAGNOSIS — Z955 Presence of coronary angioplasty implant and graft: Secondary | ICD-10-CM

## 2016-05-15 NOTE — Progress Notes (Signed)
Cardiac Individual Treatment Plan  Patient Details  Name: Adam Patterson MRN: [id number] Date of Birth: 17-Dec-1960 Referring Provider:   Flowsheet Row Cardiac Rehab from 02/13/2016 in [facility] Cardiac and Pulmonary Rehab  Referring Provider  Einar Gip      Initial Encounter Date:  Flowsheet Row Cardiac Rehab from 02/13/2016 in [facility] Cardiac and Pulmonary Rehab  Date  02/13/16  Referring Provider  Einar Gip      Visit Diagnosis: S/P coronary artery stent placement  Patient's Home Medications on Admission:  Current Outpatient Prescriptions:  .  amLODipine-valsartan (EXFORGE) 10-320 MG tablet, Take 0.5 tablets by mouth daily., Disp: 30 tablet, Rfl: 3 .  aspirin 81 MG tablet, Take 81 mg by mouth daily.  , Disp: , Rfl:  .  carvedilol (COREG) 3.125 MG tablet, Take 1 tablet (3.125 mg total) by mouth 2 (two) times daily with a meal., Disp: 60 tablet, Rfl: 0 .  Cholecalciferol (VITAMIN D3) 2000 UNITS TABS, Take 1 tablet by mouth daily.  , Disp: , Rfl:  .  DULoxetine (CYMBALTA) 60 MG capsule, Take 1 capsule (60 mg total) by mouth daily., Disp: 30 capsule, Rfl: 3 .  fish oil-omega-3 fatty acids 1000 MG capsule, Take 2 g by mouth 2 (two) times daily.  , Disp: , Rfl:  .  FLUoxetine (PROZAC) 20 MG tablet, Take 1 tablet by mouth daily., Disp: , Rfl: 3 .  glimepiride (AMARYL) 4 MG tablet, Take 4 mg by mouth daily with breakfast., Disp: , Rfl:  .  Insulin Pen Needle (NOVOFINE) 30G X 8 MM MISC, Patient test blood sugar two times a day. (Patient not taking: Reported on 02/13/2016), Disp: 100 each, Rfl: 6 .  metFORMIN (GLUCOPHAGE) 1000 MG tablet, Take 1 tablet (1,000 mg total) by mouth 2 (two) times daily., Disp: , Rfl: 3 .  Multiple Vitamin (MULTIVITAMIN PO), Take 1 tablet by mouth daily.  , Disp: , Rfl:  .  mupirocin ointment (BACTROBAN) 2 %, Place 1 application into the nose 2 (two) times daily., Disp: 22 g, Rfl: 0 .  nitroGLYCERIN (NITROSTAT) 0.4 MG SL tablet, Place 1 tablet (0.4 mg total) under the tongue every  5 (five) minutes x 3 doses as needed for chest pain., Disp: 25 tablet, Rfl: 1 .  rosuvastatin (CRESTOR) 40 MG tablet, Take 1 tablet by mouth daily., Disp: , Rfl: 3 .  ticagrelor (BRILINTA) 90 MG TABS tablet, Take 1 tablet (90 mg total) by mouth 2 (two) times daily., Disp: 60 tablet, Rfl: 11  Past Medical History: Past Medical History:  Diagnosis Date  . Abnormal liver enzymes 2006   no prior workup  . Diabetes mellitus   . Hyperlipidemia   . Hypertension   . Myocardial infarction   . Obesity   . Psoriasis    affecting hands (skin)  . Sleep apnea sleep study 2007   obstructive, not using mask    Tobacco Use: History  Smoking Status  . Former Smoker  . Types: Pipe  . Quit date: 03/30/2010  Smokeless Tobacco  . Never Used    Labs: Recent Review Flowsheet Data    Labs for ITP Cardiac and Pulmonary Rehab Latest Ref Rng & Units 02/04/2016 02/05/2016   Cholestrol 0 - 200 mg/dL 113 -   LDLCALC 0 - 99 mg/dL 49 -   HDL >40 mg/dL 54 -   Trlycerides <150 mg/dL 48 -   Hemoglobin A1c 4.8 - 5.6 % - 6.5(H)       Exercise Target Goals:    Exercise  Program Goal: Individual exercise prescription set with THRR, safety & activity barriers. Participant demonstrates ability to understand and report RPE using BORG scale, to self-measure pulse accurately, and to acknowledge the importance of the exercise prescription.  Exercise Prescription Goal: Starting with aerobic activity 30 plus minutes a day, 3 days per week for initial exercise prescription. Provide home exercise prescription and guidelines that participant acknowledges understanding prior to discharge.  Activity Barriers & Risk Stratification:     Activity Barriers & Cardiac Risk Stratification - 02/13/16 1249      Activity Barriers & Cardiac Risk Stratification   Activity Barriers None   Cardiac Risk Stratification High      6 Minute Walk:     6 Minute Walk    Row Name 02/13/16 1242         6 Minute Walk    Distance 1645 feet     Walk Time 6 minutes     MPH 3.1     METS 3.81     RPE 11     VO2 Peak 13.4     Symptoms No     Resting HR 65 bpm     Resting BP 152/80     Max Ex. HR 104 bpm     Max Ex. BP 168/78        Initial Exercise Prescription:     Initial Exercise Prescription - 02/13/16 1200      Date of Initial Exercise RX and Referring Provider   Date 02/13/16   Referring Provider Ganji     Treadmill   MPH 3.4   Grade 0.5   Minutes 15   METs 3.84     Recumbant Bike   Level 3   RPM 60   Minutes 15   METs 3.8     Elliptical   Level 1   Speed 3     REL-XR   Level 3   Minutes 15   METs 3.8     T5 Nustep   Level 4   Minutes 15   METs 3.8     Prescription Details   Frequency (times per week) 3   Duration Progress to 45 minutes of aerobic exercise without signs/symptoms of physical distress     Intensity   THRR 40-80% of Max Heartrate 104-144   Ratings of Perceived Exertion 11-13   Perceived Dyspnea 0-4     Progression   Progression Continue to progress workloads to maintain intensity without signs/symptoms of physical distress.     Resistance Training   Training Prescription Yes   Weight 3   Reps 10-15      Perform Capillary Blood Glucose checks as needed.  Exercise Prescription Changes:     Exercise Prescription Changes    Row Name 02/19/16 1500 03/06/16 1100 03/19/16 1600         Exercise Review   Progression -  First full day of exercise Yes Yes       Response to Exercise   Blood Pressure (Admit) 130/70 116/64 126/64     Blood Pressure (Exercise) 140/88 136/74 156/80     Blood Pressure (Exit) 124/62 120/82 132/62     Heart Rate (Admit) 57 bpm 105 bpm 74 bpm     Heart Rate (Exercise) 93 bpm 109 bpm 114 bpm     Heart Rate (Exit) 51 bpm 75 bpm 69 bpm     Rating of Perceived Exertion (Exercise) _0 Symptoms none none none  Comments  - Home Exercise Guidelines given 02/22/16 Home Exercise Guidelines given 02/22/16      Duration Progress to 45 minutes of aerobic exercise without signs/symptoms of physical distress Progress to 45 minutes of aerobic exercise without signs/symptoms of physical distress Progress to 45 minutes of aerobic exercise without signs/symptoms of physical distress     Intensity THRR unchanged THRR unchanged THRR unchanged       Progression   Progression Continue to progress workloads to maintain intensity without signs/symptoms of physical distress. Continue to progress workloads to maintain intensity without signs/symptoms of physical distress. Continue to progress workloads to maintain intensity without signs/symptoms of physical distress.     Average METs 4.25 4.02 4.02       Resistance Training   Training Prescription Yes Yes Yes     Weight 3 lbs 5 lbs 6 lbs     Reps 10-15 10-15 10-15       Interval Training   Interval Training No No Yes     Equipment  -  - REL-XR     Comments  -  - started 2 min and 30 sec        Treadmill   MPH  - 3.4 3.4     Grade  - 1 1     Minutes  - 15 15     METs  - 4.07 4.07       Elliptical   Level 1  -  -     Speed 3  -  -       REL-XR   Level _0 Minutes _1 METs 2.5 5.4 5.4       T5 Nustep   Level  - 6 6     Minutes  - 15 15     METs  - 2.6 3.4       Home Exercise Plan   Plans to continue exercise at  - Home  walk at home and at [place]  walk at home and at mall     Frequency  - Add 3 additional days to program exercise sessions. Add 3 additional days to program exercise sessions.        Exercise Comments:     Exercise Comments    Row Name 02/18/16 0849 02/20/16 1002 02/22/16 1000 02/22/16 1013 03/06/16 1153   Exercise Comments First full day of exercise!  Patient was oriented to gym and equipment including functions, settings, policies, and procedures.  Patient's individual exercise prescription and treatment plan were reviewed.  All starting workloads were established based on the results of the 6 minute walk  test done at initial orientation visit.  The plan for exercise progression was also introduced and progression will be customized based on patient's performance and goals. Geoffer said today that he feels so happy to be able to move again!! Reviewed METs average and discussed progression with pt today. Reviewed home exercise with pt today.  Pt plans to walk at the mall for exercise.  Reviewed THR, pulse, RPE, sign and symptoms, NTG use, and when to call 911 or MD.  Also discussed weather considerations and indoor options.  Pt voiced understanding. Daneil is doing great with exercise.  He has already started to lose weight!  He is already up over 5 METs on the XR. We will continue to monitor for progression.   [place] Name 03/07/16 [phone] 03/19/16 1625 04/16/16 1446  Exercise Comments Reviewed METs average and discussed progression with pt today. Morell has been out since 8/25.  He had been doing well with exercise.  We will continue to monitor. Has been out since last review due to work.        Discharge Exercise Prescription (Final Exercise Prescription Changes):     Exercise Prescription Changes - 03/19/16 1600      Exercise Review   Progression Yes     Response to Exercise   Blood Pressure (Admit) 126/64   Blood Pressure (Exercise) 156/80   Blood Pressure (Exit) 132/62   Heart Rate (Admit) 74 bpm   Heart Rate (Exercise) 114 bpm   Heart Rate (Exit) 69 bpm   Rating of Perceived Exertion (Exercise) 14   Symptoms none   Comments Home Exercise Guidelines given 02/22/16   Duration Progress to 45 minutes of aerobic exercise without signs/symptoms of physical distress   Intensity THRR unchanged     Progression   Progression Continue to progress workloads to maintain intensity without signs/symptoms of physical distress.   Average METs 4.02     Resistance Training   Training Prescription Yes   Weight 6 lbs   Reps 10-15     Interval Training   Interval Training Yes   Equipment  REL-XR   Comments started 2 min and 30 sec      Treadmill   MPH 3.4   Grade 1   Minutes 15   METs 4.07     REL-XR   Level 5   Minutes 15   METs 5.4     T5 Nustep   Level 6   Minutes 15   METs 3.4     Home Exercise Plan   Plans to continue exercise at Home  walk at home and at mall   Frequency Add 3 additional days to program exercise sessions.      Nutrition:  Target Goals: Understanding of nutrition guidelines, daily intake of sodium <1544m, cholesterol <2058m calories 30% from fat and 7% or less from saturated fats, daily to have 5 or more servings of fruits and vegetables.  Biometrics:     Pre Biometrics - 02/13/16 1241      Pre Biometrics   Height _0  (1.778 m)   Weight 281 lb 9.6 oz (127.7 kg)   Waist Circumference 50.13 inches   Hip Circumference 58 inches   Waist to Hip Ratio 0.86 %   BMI (Calculated) 40.5   Single Leg Stand 30 seconds       Nutrition Therapy Plan and Nutrition Goals:     Nutrition Therapy & Goals - 02/19/16 1151      Nutrition Therapy   Diet Instructed on a 2000 calorie meal plan including heart healthy and diabetes dietary guidelines   Protein (specify units) 8   Fiber 30 grams   Whole Grain Foods 3 servings   Saturated Fats 13 max. grams   Fruits and Vegetables 5 servings/day  range of 5-8 servings daily   Sodium 2000 grams  1500 ideal     Personal Nutrition Goals   Personal Goal #1 Balance meals with protein, 2-4 servings of carbohydrate and non-starchy vegetables.   Personal Goal #2 Try low sodium products discussed such as StarKist very low sodium tuna, Tostado brand taco bowls, ViErit[place]ow sodium sauce   Personal Goal #3 Read labels for saturated fat, trans fat and sodium.   Comments Patient has made significant positive diet changes and overall is already following a  heart healthy meal plan. Reviewed recommended servings of carbohydrate on this calorie level.     Intervention Plan   Intervention Prescribe,  educate and counsel regarding individualized specific dietary modifications aiming towards targeted core components such as weight, hypertension, lipid management, diabetes, heart failure and other comorbidities.;Nutrition handout(s) given to patient.   Expected Outcomes Short Term Goal: Understand basic principles of dietary content, such as calories, fat, sodium, cholesterol and nutrients.;Short Term Goal: A plan has been developed with personal nutrition goals set during dietitian appointment.;Long Term Goal: Adherence to prescribed nutrition plan.      Nutrition Discharge: Rate Your Plate Scores:     Nutrition Assessments - 02/25/16 1534      Rate Your Plate Scores   Pre Score 68   Pre Score % 75.5 %      Nutrition Goals Re-Evaluation:   Psychosocial: Target Goals: Acknowledge presence or absence of depression, maximize coping skills, provide positive support system. Participant is able to verbalize types and ability to use techniques and skills needed for reducing stress and depression.  Initial Review & Psychosocial Screening:     Initial Psych Review & Screening - 02/13/16 1253      Initial Review   Current issues with History of Depression     Family Dynamics   Good Support System? Yes     Screening Interventions   Interventions Encouraged to exercise;Program counselor consult      Quality of Life Scores:     Quality of Life - 02/13/16 1319      Quality of Life Scores   Health/Function Pre 13.6 %   Socioeconomic Pre 16.07 %   Psych/Spiritual Pre 16 %   Family Pre 22.8 %   GLOBAL Pre 15.95 %      PHQ-9: Recent Review Flowsheet Data    Depression screen [facility] 2/9 02/13/2016   Decreased Interest 0   Down, Depressed, Hopeless 1   PHQ - 2 Score 1   Altered sleeping 0   Tired, decreased energy 0   Feeling bad or failure about yourself  3   Trouble concentrating 0   Moving slowly or fidgety/restless 0   Suicidal thoughts 0   PHQ-9 Score 4       Psychosocial Evaluation and Intervention:     Psychosocial Evaluation - 02/20/16 1000      Psychosocial Evaluation & Interventions   Comments Follow with Mr. Heinicke today stating he is already experiencing more energy since starting this class, and was excited to report he made a healthy dinner last night and slept well after the first class.  Counselor will continue to follow with Mr. Mcgue.         Psychosocial Re-Evaluation:   Vocational Rehabilitation: Provide vocational rehab assistance to qualifying candidates.   Vocational Rehab Evaluation & Intervention:     Vocational Rehab - 02/13/16 1250      Initial Vocational Rehab Evaluation & Intervention   Assessment shows need for Vocational Rehabilitation No      Education: Education Goals: Education classes will be provided on a weekly basis, covering required topics. Participant will state understanding/return demonstration of topics presented.  Learning Barriers/Preferences:     Learning Barriers/Preferences - 02/13/16 1249      Learning Barriers/Preferences   Learning Barriers None   Learning Preferences Verbal Instruction;Individual Instruction      Education Topics: General Nutrition Guidelines/Fats and Fiber: -Group instruction provided by verbal, written material, models and posters to present the general guidelines for heart healthy nutrition.  Gives an explanation and review of dietary fats and fiber.   Controlling Sodium/Reading Food Labels: -Group verbal and written material supporting the discussion of sodium use in heart healthy nutrition. Review and explanation with models, verbal and written materials for utilization of the food label. Flowsheet Row Cardiac Rehab from 02/27/2016 in [facility] Cardiac and Pulmonary Rehab  Date  02/18/16  Educator  CR  Instruction Review Code  2- meets goals/outcomes      Exercise Physiology & Risk Factors: - Group verbal and written instruction with models to  review the exercise physiology of the cardiovascular system and associated critical values. Details cardiovascular disease risk factors and the goals associated with each risk factor.   Aerobic Exercise & Resistance Training: - Gives group verbal and written discussion on the health impact of inactivity. On the components of aerobic and resistive training programs and the benefits of this training and how to safely progress through these programs. Flowsheet Row Cardiac Rehab from 02/27/2016 in [facility] Cardiac and Pulmonary Rehab  Date  02/20/16  Educator  [facility]  Instruction Review Code  2- meets goals/outcomes      Flexibility, Balance, General Exercise Guidelines: - Provides group verbal and written instruction on the benefits of flexibility and balance training programs. Provides general exercise guidelines with specific guidelines to those with heart or lung disease. Demonstration and skill practice provided. Flowsheet Row Cardiac Rehab from 02/27/2016 in [facility] Cardiac and Pulmonary Rehab  Date  02/25/16  Educator  [facility]  Instruction Review Code  2- meets goals/outcomes      Stress Management: - Provides group verbal and written instruction about the health risks of elevated stress, cause of high stress, and healthy ways to reduce stress. Flowsheet Row Cardiac Rehab from 02/27/2016 in [facility] Cardiac and Pulmonary Rehab  Date  02/27/16  Educator  [facility]  Instruction Review Code  2- meets goals/outcomes      Depression: - Provides group verbal and written instruction on the correlation between heart/lung disease and depressed mood, treatment options, and the stigmas associated with seeking treatment.   Anatomy & Physiology of the Heart: - Group verbal and written instruction and models provide basic cardiac anatomy and physiology, with the coronary electrical and arterial systems. Review of: AMI, Angina, Valve disease, Heart Failure, Cardiac Arrhythmia, Pacemakers, and the ICD.   Cardiac  Procedures: - Group verbal and written instruction and models to describe the testing methods done to diagnose heart disease. Reviews the outcomes of the test results. Describes the treatment choices: Medical Management, Angioplasty, or Coronary Bypass Surgery.   Cardiac Medications: - Group verbal and written instruction to review commonly prescribed medications for heart disease. Reviews the medication, class of the drug, and side effects. Includes the steps to properly store meds and maintain the prescription regimen.   Go Sex-Intimacy & Heart Disease, Get SMART - Goal Setting: - Group verbal and written instruction through game format to discuss heart disease and the return to sexual intimacy. Provides group verbal and written material to discuss and apply goal setting through the application of the S.M.A.R.T. Method.   Other Matters of the Heart: - Provides group verbal, written materials and models to describe Heart Failure, Angina, Valve Disease, and Diabetes in the realm of heart disease. Includes description of the disease process and treatment options available to the cardiac patient.   Exercise & Equipment Safety: - Individual verbal instruction and demonstration of equipment use and safety with use of the equipment. Flowsheet Row Cardiac Rehab from 02/27/2016 in  [place] Cardiac and Pulmonary Rehab  Date  02/13/16  Educator  C. EnterkinRN  Instruction Review Code  1- partially meets, needs review/practice      Infection Prevention: - Provides verbal and written material to individual with discussion of infection control including proper hand washing and proper equipment cleaning during exercise session. Flowsheet Row Cardiac Rehab from 02/27/2016 in [facility] Cardiac and Pulmonary Rehab  Date  02/13/16  Educator  C. Enterkin,RN  Instruction Review Code  2- meets goals/outcomes      Falls Prevention: - Provides verbal and written material to individual with discussion of falls  prevention and safety. Flowsheet Row Cardiac Rehab from 02/27/2016 in [facility] Cardiac and Pulmonary Rehab  Date  02/13/16  Educator  C. [place]  Instruction Review Code  2- meets goals/outcomes      Diabetes: - Individual verbal and written instruction to review signs/symptoms of diabetes, desired ranges of glucose level fasting, after meals and with exercise. Advice that pre and post exercise glucose checks will be done for 3 sessions at entry of program. [place] from 02/27/2016 in [facility] Cardiac and Pulmonary Rehab  Date  02/13/16  Educator  C. EnterkinRN  Instruction Review Code  2- meets goals/outcomes       Knowledge Questionnaire Score:     Knowledge Questionnaire Score - 02/13/16 1320      Knowledge Questionnaire Score   Pre Score 27      Core Components/Risk Factors/Patient Goals at Admission:     Personal Goals and Risk Factors at Admission - 02/13/16 1252      Core Components/Risk Factors/Patient Goals on Admission    Weight Management Obesity;Weight Maintenance;Weight Loss   Increase Strength and Stamina Yes   Intervention Provide advice, education, support and counseling about physical activity/exercise needs.;Develop an individualized exercise prescription for aerobic and resistive training based on initial evaluation findings, risk stratification, comorbidities and participant's personal goals.   Expected Outcomes Achievement of increased cardiorespiratory fitness and enhanced flexibility, muscular endurance and strength shown through measurements of functional capacity and personal statement of participant.   Diabetes Yes   Intervention Provide education about signs/symptoms and action to take for hypo/hyperglycemia.;Provide education about proper nutrition, including hydration, and aerobic/resistive exercise prescription along with prescribed medications to achieve blood glucose in normal ranges: Fasting glucose 65-99 mg/dL   Expected Outcomes  Short Term: Participant verbalizes understanding of the signs/symptoms and immediate care of hyper/hypoglycemia, proper foot care and importance of medication, aerobic/resistive exercise and nutrition plan for blood glucose control.;Long Term: Attainment of HbA1C < 7%.   Hypertension Yes   Intervention Provide education on lifestyle modifcations including regular physical activity/exercise, weight management, moderate sodium restriction and increased consumption of fresh fruit, vegetables, and low fat dairy, alcohol moderation, and smoking cessation.;Monitor prescription use compliance.   Expected Outcomes Short Term: Continued assessment and intervention until BP is < 140/79m HG in hypertensive participants. < 130/81mHG in hypertensive participants with diabetes, heart failure or chronic kidney disease.;Long Term: Maintenance of blood pressure at goal levels.   Lipids Yes   Intervention Provide education and support for participant on nutrition & aerobic/resistive exercise along with prescribed medications to achieve LDL <7076mHDL >36m80m Expected Outcomes Short Term: Participant states understanding of desired cholesterol values and is compliant with medications prescribed. Participant is following exercise prescription and nutrition guidelines.;Long Term: Cholesterol controlled with medications as prescribed, with individualized exercise RX and with personalized nutrition plan. Value goals: LDL < 70mg61mL > 40 mg.  Core Components/Risk Factors/Patient Goals Review:      Goals and Risk Factor Review    Row Name 02/20/16 1002 03/05/16 0957           Core Components/Risk Factors/Patient Goals Review   Personal Goals Review Weight Management/Obesity;Sedentary;Increase Strength and Stamina;Diabetes;Lipids;Hypertension Weight Management/Obesity;Sedentary;Increase Strength and Stamina;Stress;Hypertension;Diabetes;Lipids      Review Murtaza is on day two today with exercise.  He is already  feeling better, stating that it feels so good to be moving again.  We reviewed his goals and risk factors that we will be focusing on during rehab.  He has already met with the dietician and has started to make changes to his diet. Yoshio has been walking at home and coming to class.  He says that he is feeling good and losing 1 lb a week. His blood pressures are doing well, and he is checking them at home with a new wrist cuff.  He has not had any problems with his statins.  His blood sugars are doing well since changing to glipuride.  He is stressed about waiting for work.  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      Expected Outcomes Kyshawn will continue to come to education and exercise classes to learn more about his risk factors and improve his health.  We will continue to monitor his progress. Seibert will continue to come to classes for exercise and education.  We will continue to monitor him during the program.         Core Components/Risk Factors/Patient Goals at Discharge (Final Review):      Goals and Risk Factor Review - 03/05/16 0957      Core Components/Risk Factors/Patient Goals Review   Personal Goals Review Weight Management/Obesity;Sedentary;Increase Strength and Stamina;Stress;Hypertension;Diabetes;Lipids   Review Moise has been walking at home and coming to class.  He says that he is feeling good and losing 1 lb a week. His blood pressures are doing well, and he is checking them at home with a new wrist cuff.  He has not had any problems with his statins.  His blood sugars are doing well since changing to glipuride.  He is stressed about waiting for work.  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   Expected Outcomes Mohd. will continue to come to classes for exercise and education.  We will continue to monitor him during the program.      ITP Comments:     ITP Comments    Row Name 02/13/16 1252 02/27/16 0747 03/19/16 1626 03/26/16 0628  04/03/16 1537   ITP Comments REady to start Cardiac REhab. He is tired of sitting at home waiting to get clearance to go back to work.  30 day review. Continue with ITP unless changes noted by Medical Director at signature of review. Stirling has been out since 8/25.  LM on VM to check on status. 30 day review. Continue with ITP unless changes noted by Medical Director at signature of review. Called to check on status of return to program.  He continues to be called into work.  He wants to continue and hopes to be back soon.   Maverick Name 04/16/16 1446 04/23/16 1130 04/30/16 1524 05/15/16 1418     ITP Comments Has been out since last review due to work. 30 day review. Continue with ITP unless changes noted by Medical Director at signature of review. Called to check on status of return.  LMOM Delorean  is being discharged from program.  He has not attended since 03/07/2016.       Comments: Discharge ITP

## 2016-05-15 NOTE — Progress Notes (Signed)
Discharge Summary  Patient Details  Name: Adam Patterson MRN: [id number] Date of Birth: May 23, 1961 Referring Provider:   Flowsheet Row Cardiac Rehab from 02/13/2016 in [facility]  Referring Provider  Vadito       Number of Visits: 12  Reason for Discharge:  Early Exit:  Back to work, no attendance since 03/07/16  Smoking History:  History  Smoking Status  . Former Smoker  . Types: Pipe  . Quit date: 03/30/2010  Smokeless Tobacco  . Never Used    Diagnosis:  S/P coronary artery stent placement  ADL UCSD:   Initial Exercise Prescription:     Initial Exercise Prescription - 02/13/16 1200      Date of Initial Exercise RX and Referring Provider   Date 02/13/16   Referring Provider Ganji     Treadmill   MPH 3.4   Grade 0.5   Minutes 15   METs 3.84     Recumbant Bike   Level 3   RPM 60   Minutes 15   METs 3.8     Elliptical   Level 1   Speed 3     REL-XR   Level 3   Minutes 15   METs 3.8     T5 Nustep   Level 4   Minutes 15   METs 3.8     Prescription Details   Frequency (times per week) 3   Duration Progress to 45 minutes of aerobic exercise without signs/symptoms of physical distress     Intensity   THRR 40-80% of Max Heartrate 104-144   Ratings of Perceived Exertion 11-13   Perceived Dyspnea 0-4     Progression   Progression Continue to progress workloads to maintain intensity without signs/symptoms of physical distress.     Resistance Training   Training Prescription Yes   Weight 3   Reps 10-15      Discharge Exercise Prescription (Final Exercise Prescription Changes):     Exercise Prescription Changes - 03/19/16 1600      Exercise Review   Progression Yes     Response to Exercise   Blood Pressure (Admit) 126/64   Blood Pressure (Exercise) 156/80   Blood Pressure (Exit) 132/62   Heart Rate (Admit) 74 bpm   Heart Rate (Exercise) 114 bpm   Heart Rate (Exit) 69 bpm   Rating of Perceived Exertion  (Exercise) 14   Symptoms none   Comments Home Exercise Guidelines given 02/22/16   Duration Progress to 45 minutes of aerobic exercise without signs/symptoms of physical distress   Intensity THRR unchanged     Progression   Progression Continue to progress workloads to maintain intensity without signs/symptoms of physical distress.   Average METs 4.02     Resistance Training   Training Prescription Yes   Weight 6 lbs   Reps 10-15     Interval Training   Interval Training Yes   Equipment REL-XR   Comments started 2 min and 30 sec      Treadmill   MPH 3.4   Grade 1   Minutes 15   METs 4.07     REL-XR   Level 5   Minutes 15   METs 5.4     T5 Nustep   Level 6   Minutes 15   METs 3.4     Home Exercise Plan   Plans to continue exercise at Home  walk at home and at mall   Frequency Add 3 additional days to program  exercise sessions.      Functional Capacity:     6 Minute Walk    Row Name 02/13/16 1242         6 Minute Walk   Distance 1645 feet     Walk Time 6 minutes     MPH 3.1     METS 3.81     RPE 11     VO2 Peak 13.4     Symptoms No     Resting HR 65 bpm     Resting BP 152/80     Max Ex. HR 104 bpm     Max Ex. BP 168/78        Psychological, QOL, Others - Outcomes: PHQ 2/9: Depression screen PHQ 2/9 02/13/2016  Decreased Interest 0  Down, Depressed, Hopeless 1  PHQ - 2 Score 1  Altered sleeping 0  Tired, decreased energy 0  Feeling bad or failure about yourself  3  Trouble concentrating 0  Moving slowly or fidgety/restless 0  Suicidal thoughts 0  PHQ-9 Score 4    Quality of Life:     Quality of Life - 02/13/16 1319      Quality of Life Scores   Health/Function Pre 13.6 %   Socioeconomic Pre 16.07 %   Psych/Spiritual Pre 16 %   Family Pre 22.8 %   GLOBAL Pre 15.95 %      Personal Goals: Goals established at orientation with interventions provided to work toward goal.     Personal Goals and Risk Factors at Admission - 02/13/16  1252      Core Components/Risk Factors/Patient Goals on Admission    Weight Management Obesity;Weight Maintenance;Weight Loss   Increase Strength and Stamina Yes   Intervention Provide advice, education, support and counseling about physical activity/exercise needs.;Develop an individualized exercise prescription for aerobic and resistive training based on initial evaluation findings, risk stratification, comorbidities and participant's personal goals.   Expected Outcomes Achievement of increased cardiorespiratory fitness and enhanced flexibility, muscular endurance and strength shown through measurements of functional capacity and personal statement of participant.   Diabetes Yes   Intervention Provide education about signs/symptoms and action to take for hypo/hyperglycemia.;Provide education about proper nutrition, including hydration, and aerobic/resistive exercise prescription along with prescribed medications to achieve blood glucose in normal ranges: Fasting glucose 65-99 mg/dL   Expected Outcomes Short Term: Participant verbalizes understanding of the signs/symptoms and immediate care of hyper/hypoglycemia, proper foot care and importance of medication, aerobic/resistive exercise and nutrition plan for blood glucose control.;Long Term: Attainment of HbA1C < 7%.   Hypertension Yes   Intervention Provide education on lifestyle modifcations including regular physical activity/exercise, weight management, moderate sodium restriction and increased consumption of fresh fruit, vegetables, and low fat dairy, alcohol moderation, and smoking cessation.;Monitor prescription use compliance.   Expected Outcomes Short Term: Continued assessment and intervention until BP is < 140/77m HG in hypertensive participants. < 130/834mHG in hypertensive participants with diabetes, heart failure or chronic kidney disease.;Long Term: Maintenance of blood pressure at goal levels.   Lipids Yes   Intervention Provide  education and support for participant on nutrition & aerobic/resistive exercise along with prescribed medications to achieve LDL <7027mHDL >27m6m Expected Outcomes Short Term: Participant states understanding of desired cholesterol values and is compliant with medications prescribed. Participant is following exercise prescription and nutrition guidelines.;Long Term: Cholesterol controlled with medications as prescribed, with individualized exercise RX and with personalized nutrition plan. Value goals: LDL < 70mg46mL > 40 mg.  Personal Goals Discharge:     Goals and Risk Factor Review    Row Name 02/20/16 1002 03/05/16 0957           Core Components/Risk Factors/Patient Goals Review   Personal Goals Review Weight Management/Obesity;Sedentary;Increase Strength and Stamina;Diabetes;Lipids;Hypertension Weight Management/Obesity;Sedentary;Increase Strength and Stamina;Stress;Hypertension;Diabetes;Lipids      Review Hurley is on day two today with exercise.  He is already feeling better, stating that it feels so good to be moving again.  We reviewed his goals and risk factors that we will be focusing on during rehab.  He has already met with the dietician and has started to make changes to his diet. Rumaldo has been walking at home and coming to class.  He says that he is feeling good and losing 1 lb a week. His blood pressures are doing well, and he is checking them at home with a new wrist cuff.  He has not had any problems with his statins.  His blood sugars are doing well since changing to glipuride.  He is stressed about waiting for work.  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      Expected Outcomes Tallis will continue to come to education and exercise classes to learn more about his risk factors and improve his health.  We will continue to monitor his progress. Rondel will continue to come to classes for exercise and education.  We will continue to monitor him during  the program.         Nutrition & Weight - Outcomes:     Pre Biometrics - 02/13/16 1241      Pre Biometrics   Height 5' 10"  (1.778 m)   Weight 281 lb 9.6 oz (127.7 kg)   Waist Circumference 50.13 inches   Hip Circumference 58 inches   Waist to Hip Ratio 0.86 %   BMI (Calculated) 40.5   Single Leg Stand 30 seconds       Nutrition:     Nutrition Therapy & Goals - 02/19/16 1151      Nutrition Therapy   Diet Instructed on a 2000 calorie meal plan including heart healthy and diabetes dietary guidelines   Protein (specify units) 8   Fiber 30 grams   Whole Grain Foods 3 servings   Saturated Fats 13 max. grams   Fruits and Vegetables 5 servings/day  range of 5-8 servings daily   Sodium 2000 grams  1500 ideal     Personal Nutrition Goals   Personal Goal #1 Balance meals with protein, 2-4 servings of carbohydrate and non-starchy vegetables.   Personal Goal #2 Try low sodium products discussed such as [employer] very low sodium tuna, [employer] brand taco bowls, [place] low sodium sauce   Personal Goal #3 Read labels for saturated fat, trans fat and sodium.   Comments Patient has made significant positive diet changes and overall is already following a heart healthy meal plan. Reviewed recommended servings of carbohydrate on this calorie level.     Intervention Plan   Intervention Prescribe, educate and counsel regarding individualized specific dietary modifications aiming towards targeted core components such as weight, hypertension, lipid management, diabetes, heart failure and other comorbidities.;Nutrition handout(s) given to patient.   Expected Outcomes Short Term Goal: Understand basic principles of dietary content, such as calories, fat, sodium, cholesterol and nutrients.;Short Term Goal: A plan has been developed with personal nutrition goals set during dietitian appointment.;Long Term Goal: Adherence to prescribed nutrition plan.      Nutrition Discharge:  Nutrition  Assessments - 02/25/16 1534      Rate Your Plate Scores   Pre Score 68   Pre Score % 75.5 %      Education Questionnaire Score:     Knowledge Questionnaire Score - 02/13/16 1320      Knowledge Questionnaire Score   Pre Score 27      Goals reviewed with patient; copy given to patient.

## 2016-06-06 ENCOUNTER — Other Ambulatory Visit: Payer: Self-pay

## 2016-06-06 ENCOUNTER — Emergency Department
Admission: EM | Admit: 2016-06-06 | Discharge: 2016-06-06 | Disposition: A | Discharge status: Home / Self Care | Payer: 59 | Attending: Emergency Medicine | Admitting: Emergency Medicine

## 2016-06-06 ENCOUNTER — Emergency Department: Payer: 59

## 2016-06-06 ENCOUNTER — Encounter: Payer: Self-pay | Admitting: Emergency Medicine

## 2016-06-06 DIAGNOSIS — I6502 Occlusion and stenosis of left vertebral artery: Secondary | ICD-10-CM | POA: Diagnosis not present

## 2016-06-06 DIAGNOSIS — Z87891 Personal history of nicotine dependence: Secondary | ICD-10-CM | POA: Diagnosis not present

## 2016-06-06 DIAGNOSIS — E119 Type 2 diabetes mellitus without complications: Secondary | ICD-10-CM | POA: Insufficient documentation

## 2016-06-06 DIAGNOSIS — I6523 Occlusion and stenosis of bilateral carotid arteries: Secondary | ICD-10-CM | POA: Diagnosis not present

## 2016-06-06 DIAGNOSIS — I2510 Atherosclerotic heart disease of native coronary artery without angina pectoris: Secondary | ICD-10-CM | POA: Insufficient documentation

## 2016-06-06 DIAGNOSIS — G459 Transient cerebral ischemic attack, unspecified: Secondary | ICD-10-CM | POA: Diagnosis not present

## 2016-06-06 DIAGNOSIS — Z79899 Other long term (current) drug therapy: Secondary | ICD-10-CM | POA: Diagnosis not present

## 2016-06-06 DIAGNOSIS — I10 Essential (primary) hypertension: Secondary | ICD-10-CM | POA: Diagnosis not present

## 2016-06-06 DIAGNOSIS — Z7982 Long term (current) use of aspirin: Secondary | ICD-10-CM | POA: Insufficient documentation

## 2016-06-06 DIAGNOSIS — Z794 Long term (current) use of insulin: Secondary | ICD-10-CM | POA: Diagnosis not present

## 2016-06-06 DIAGNOSIS — R42 Dizziness and giddiness: Secondary | ICD-10-CM | POA: Diagnosis not present

## 2016-06-06 LAB — CBC
HCT: 39.8 % — ABNORMAL LOW (ref 40.0–52.0)
HEMOGLOBIN: 13.7 g/dL (ref 13.0–18.0)
MCH: 29.2 pg (ref 26.0–34.0)
MCHC: 34.3 g/dL (ref 32.0–36.0)
MCV: 85.2 fL (ref 80.0–100.0)
Platelets: 260 10*3/uL (ref 150–440)
RBC: 4.68 MIL/uL (ref 4.40–5.90)
RDW: 15.5 % — ABNORMAL HIGH (ref 11.5–14.5)
WBC: 10.6 10*3/uL (ref 3.8–10.6)

## 2016-06-06 LAB — COMPREHENSIVE METABOLIC PANEL
ALBUMIN: 3.9 g/dL (ref 3.5–5.0)
ALK PHOS: 41 U/L (ref 38–126)
ALT: 15 U/L — ABNORMAL LOW (ref 17–63)
AST: 29 U/L (ref 15–41)
Anion gap: 8 (ref 5–15)
BILIRUBIN TOTAL: 0.4 mg/dL (ref 0.3–1.2)
BUN: 12 mg/dL (ref 6–20)
CO2: 28 mmol/L (ref 22–32)
Calcium: 9.2 mg/dL (ref 8.9–10.3)
Chloride: 102 mmol/L (ref 101–111)
Creatinine, Ser: 1.03 mg/dL (ref 0.61–1.24)
GFR calc Af Amer: >60 mL/min (ref >60)
GFR calc non Af Amer: >60 mL/min (ref >60)
GLUCOSE: 154 mg/dL — AB (ref 65–99)
POTASSIUM: 3.4 mmol/L — AB (ref 3.5–5.1)
SODIUM: 138 mmol/L (ref 135–145)
TOTAL PROTEIN: 6.9 g/dL (ref 6.5–8.1)

## 2016-06-06 LAB — DIFFERENTIAL
BASOS ABS: 0.1 10*3/uL (ref 0–0.1)
Basophils Relative: 1 %
EOS ABS: 0.3 10*3/uL (ref 0–0.7)
Eosinophils Relative: 3 %
LYMPHS ABS: 1.2 10*3/uL (ref 1.0–3.6)
LYMPHS PCT: 11 %
Monocytes Absolute: 0.5 10*3/uL (ref 0.2–1.0)
Monocytes Relative: 5 %
NEUTROS PCT: 80 %
Neutro Abs: 8.5 10*3/uL — ABNORMAL HIGH (ref 1.4–6.5)

## 2016-06-06 LAB — TROPONIN I: Troponin I: 0.04 ng/mL (ref <0.03)

## 2016-06-06 LAB — PROTIME-INR
INR: 1.01
Prothrombin Time: 13.3 seconds (ref 11.4–15.2)

## 2016-06-06 LAB — APTT: APTT: 31 s (ref 24–36)

## 2016-06-06 LAB — GLUCOSE, CAPILLARY: Glucose-Capillary: 168 mg/dL — ABNORMAL HIGH (ref 65–99)

## 2016-06-06 MED ORDER — POTASSIUM CHLORIDE CRYS ER 20 MEQ PO TBCR: 20.0000 meq | EXTENDED_RELEASE_TABLET | Freq: Once | ORAL | Status: DC

## 2016-06-06 MED ORDER — ASPIRIN 81 MG PO CHEW
CHEWABLE_TABLET | ORAL | Status: AC
  Filled 2016-06-06: qty 1

## 2016-06-06 MED ORDER — POTASSIUM CHLORIDE CRYS ER 20 MEQ PO TBCR
40.0000 meq | EXTENDED_RELEASE_TABLET | Freq: Once | ORAL | Status: AC
  Administered 2016-06-06: 40 meq via ORAL
  Filled 2016-06-06: qty 2

## 2016-06-06 MED ORDER — IOPAMIDOL (ISOVUE-370) INJECTION 76%
75.0000 mL | Freq: Once | INTRAVENOUS | Status: AC | PRN
  Administered 2016-06-06: 75 mL via INTRAVENOUS

## 2016-06-06 MED ORDER — ASPIRIN 81 MG PO CHEW
324.0000 mg | CHEWABLE_TABLET | Freq: Once | ORAL | Status: AC
  Administered 2016-06-06: 324 mg via ORAL
  Filled 2016-06-06: qty 4

## 2016-06-06 MED ORDER — MECLIZINE HCL 25 MG PO TABS
25.0000 mg | ORAL_TABLET | Freq: Once | ORAL | Status: AC
  Administered 2016-06-06: 25 mg via ORAL
  Filled 2016-06-06: qty 1

## 2016-06-06 NOTE — ED Notes (Addendum)
NAD. Waiting on decision if able to go home with outpt FU or needs to stay. No needs at this time. Pt is alert and oriented. No changes in neurological state

## 2016-06-06 NOTE — Care Management (Signed)
ED physician notified CM that this patient presented with sx concerning for tia/cva. Deficits resolving.  Discussed that if ED workup is negative and patient need further tests/ obervation- he should be placed in observation and not an admission

## 2016-06-06 NOTE — ED Triage Notes (Signed)
Pt reports feeling "off this morning", reports diaphoretic episode and increased thirst and disorientation since 0900. Pt neurologically intact.

## 2016-06-06 NOTE — Consult Note (Signed)
Reason for Consult:  Chief Complaint  Patient presents with  . Dizziness   Referring Physician: Cinda Quest, MD  Adam Patterson is an 55 y.o. male.  HPI: Patient with past medical history of coronary artery disease status post 3 stents on aspirin, Brilliant and statin, diabetes mellitus, hypertension, hyperlipidemia presented to the emergency department with a chief complaint of dizziness associated with slow thinking at work. His coworker has noticed him having slurry speech at around 9 AM which prompted him to come into the emergency department. CT head is negative. ED physician Dr. Rip Harbour has discussed with the neurologist and had CT angiogram of the head and neck which were normal. Hospitalist team is called to admit the patient but patient's symptoms are completely resolved within 30 minutes and asymptomatic during the hospital course. This was discussed with the on-call neurologist Dr. Leotis Shames, who has reviewed patient's CT head and CT angiogram of the head and neck and recommended to discharge the patient. Plan is to continue his home medications aspirin, brilinta and statin.   Past Medical History:  Diagnosis Date  . Abnormal liver enzymes 2006   no prior workup  . Diabetes mellitus   . Hyperlipidemia   . Hypertension   . Myocardial infarction   . Obesity   . Psoriasis    affecting hands (skin)  . Sleep apnea sleep study 2007   obstructive, not using mask    Past Surgical History:  Procedure Laterality Date  . CARDIAC CATHETERIZATION    . CARDIAC CATHETERIZATION N/A 02/04/2016   Procedure: Left Heart Cath and Coronary Angiography;  Surgeon: Adrian Prows, MD;  Location: [facility];  Service: Cardiovascular;  Laterality: N/A;  . CARDIAC CATHETERIZATION N/A 02/04/2016   Procedure: Coronary Stent Intervention;  Surgeon: Adrian Prows, MD;  Location: [facility];  Service: Cardiovascular;  Laterality: N/A;  . HERNIA REPAIR      Family History  Problem Relation Age of  Onset  . BRCA 1/2 Mother 52  . Diabetes Mother   . Diabetes Maternal Grandmother     Social History:  reports that he quit smoking about 6 years ago. His smoking use included Pipe. He has never used smokeless tobacco. He reports that he drinks alcohol. He reports that he does not use drugs.  Allergies: No Known Allergies  Medications: I have reviewed the patient's current medications.  Results for orders placed or performed during the hospital encounter of 06/06/16 (from the past 48 hour(s))  Glucose, capillary     Status: Abnormal   Collection Time: 06/06/16 11:36 AM  Result Value Ref Range   Glucose-Capillary 168 (H) 65 - 99 mg/dL  Protime-INR     Status: None   Collection Time: 06/06/16 11:59 AM  Result Value Ref Range   Prothrombin Time 13.3 11.4 - 15.2 seconds   INR 1.01   APTT     Status: None   Collection Time: 06/06/16 11:59 AM  Result Value Ref Range   aPTT 31 24 - 36 seconds  CBC     Status: Abnormal   Collection Time: 06/06/16 11:59 AM  Result Value Ref Range   WBC 10.6 3.8 - 10.6 K/uL   RBC 4.68 4.40 - 5.90 MIL/uL   Hemoglobin 13.7 13.0 - 18.0 g/dL   HCT 39.8 (L) 40.0 - 52.0 %   MCV 85.2 80.0 - 100.0 fL   MCH 29.2 26.0 - 34.0 pg   MCHC 34.3 32.0 - 36.0 g/dL   RDW 15.5 (H) 11.5 -  14.5 %   Platelets 260 150 - 440 K/uL  Differential     Status: Abnormal   Collection Time: 06/06/16 11:59 AM  Result Value Ref Range   Neutrophils Relative % 80 %   Neutro Abs 8.5 (H) 1.4 - 6.5 K/uL   Lymphocytes Relative 11 %   Lymphs Abs 1.2 1.0 - 3.6 K/uL   Monocytes Relative 5 %   Monocytes Absolute 0.5 0.2 - 1.0 K/uL   Eosinophils Relative 3 %   Eosinophils Absolute 0.3 0 - 0.7 K/uL   Basophils Relative 1 %   Basophils Absolute 0.1 0 - 0.1 K/uL  Comprehensive metabolic panel     Status: Abnormal   Collection Time: 06/06/16 11:59 AM  Result Value Ref Range   Sodium 138 135 - 145 mmol/L   Potassium 3.4 (L) 3.5 - 5.1 mmol/L   Chloride 102 101 - 111 mmol/L   CO2 28 22 -  32 mmol/L   Glucose, Bld 154 (H) 65 - 99 mg/dL   BUN 12 6 - 20 mg/dL   Creatinine, Ser 1.03 0.61 - 1.24 mg/dL   Calcium 9.2 8.9 - 10.3 mg/dL   Total Protein 6.9 6.5 - 8.1 g/dL   Albumin 3.9 3.5 - 5.0 g/dL   AST 29 15 - 41 U/L   ALT 15 (L) 17 - 63 U/L   Alkaline Phosphatase 41 38 - 126 U/L   Total Bilirubin 0.4 0.3 - 1.2 mg/dL   GFR calc non Af Amer >60 >60 mL/min   GFR calc Af Amer >60 >60 mL/min    Comment: (NOTE) The eGFR has been calculated using the CKD EPI equation. This calculation has not been validated in all clinical situations. eGFR's persistently <60 mL/min signify possible Chronic Kidney Disease.    Anion gap 8 5 - 15  Troponin I     Status: Abnormal   Collection Time: 06/06/16 11:59 AM  Result Value Ref Range   Troponin I 0.04 (HH) <0.03 ng/mL    Comment: CRITICAL RESULT CALLED TO, READ BACK BY AND VERIFIED WITH HUNTER ORE @ 1244 06/06/16 BY [facility]     Ct Angio Head W Or Wo Contrast  Result Date: 06/06/2016 CLINICAL DATA:  55 year old male code stroke. Slurred speech and dizziness. Initial encounter. EXAM: CT ANGIOGRAPHY HEAD AND NECK TECHNIQUE: Multidetector CT imaging of the head and neck was performed using the standard protocol during bolus administration of intravenous contrast. Multiplanar CT image reconstructions and MIPs were obtained to evaluate the vascular anatomy. Carotid stenosis measurements (when applicable) are obtained utilizing NASCET criteria, using the distal internal carotid diameter as the denominator. CONTRAST:  75 mL Isovue 370 COMPARISON:  Head CT without contrast 1140 hours today. FINDINGS: CTA NECK Skeleton: Chronic right clavicle deformity. Degenerative changes in the spine. No acute osseous abnormality identified. Visualized paranasal sinuses and mastoids are stable and well pneumatized. Upper chest: Negative lung apices.  Negative superior mediastinum. Other neck: Subcentimeter thyroid nodules, calcified on the right, not meeting consensus  criteria for ultrasound follow-up. Larynx, pharynx, parapharyngeal spaces, retropharyngeal space, sublingual space, submandibular glands and parotid glands are within normal limits. Visualized orbits and scalp soft tissues are within normal limits. No cervical lymphadenopathy. Aortic arch: 3 vessel arch configuration.  No arch atherosclerosis. Right carotid system: Mild obscuration of the right brachiocephalic artery related to right subclavian vein contrast streak artifact. The brachiocephalic artery and right CCA origin appear normal. Mildly tortuous proximal right CCA. Minimal soft plaque at the right carotid bifurcation and right  ICA origin. No stenosis. Left carotid system: Negative left CCA. Minimal soft plaque left carotid bifurcation. Negative cervical left ICA aside from mild tortuosity. Vertebral arteries: No proximal right subclavian artery or right vertebral artery origin stenosis. Negative cervical right vertebral artery. Mild soft plaque in the medial aspect of the proximal left subclavian artery without stenosis. There does appear to be soft plaque at the left vertebral artery origin resulting in mild stenosis as seen on series 8, image 124. Mildly tortuous but otherwise negative cervical left vertebral artery. CTA HEAD Posterior circulation: No distal vertebral stenosis. Patent vertebrobasilar junction. Dominant appearing AICA is with patent origins. No basilar stenosis. Normal SCA and left PCA origin. Fetal type right PCA origin. Left posterior communicating artery diminutive or absent. PCA branches are within normal limits. Anterior circulation: Both ICA siphons are patent. Minimal to mild cavernous segment and supraclinoid calcified plaque without stenosis. Normal ophthalmic and right posterior communicating artery origins. Normal carotid termini, MCA and ACA origins. Slightly dominant left ACA A1 segment. Anterior communicating artery and bilateral ACA branches are normal. Left MCA M1 segment,  bifurcation, and left MCA branches are within normal limits. Right MCA M1 segment, bifurcation, and right MCA branches are within normal limits. Venous sinuses: Patent. Anatomic variants: Fetal type right PCA origin. Delayed phase: Gray-white matter differentiation throughout the brain remains normal. No abnormal enhancement identified. Review of the MIP images confirms the above findings IMPRESSION: 1. Negative for emergent large vessel occlusion. 2. Minimal to mild carotid atherosclerosis without stenosis. Otherwise negative anterior circulation. 3. Mild soft plaque in the proximal left subclavian artery and at the left vertebral artery origin resulting in mild left vertebral origin stenosis. Otherwise negative posterior circulation. 4. Stable and normal CT appearance of the brain. 5. No acute findings in the neck. Electronically Signed   By: Genevie Ann M.D.   On: 06/06/2016 13:46   Ct Head Wo Contrast  Result Date: 06/06/2016 CLINICAL DATA:  55 year old male with acute slurred speech and dizziness. Code stroke. EXAM: CT HEAD WITHOUT CONTRAST TECHNIQUE: Contiguous axial images were obtained from the base of the skull through the vertex without intravenous contrast. COMPARISON:  None. FINDINGS: Brain: No evidence of infarction, hemorrhage, hydrocephalus, extra-axial collection or mass lesion/mass effect. Vascular: No hyperdense vessel or unexpected calcification. Skull: Normal. Negative for fracture or focal lesion. Sinuses/Orbits: No acute finding. Other: None. IMPRESSION: Unremarkable noncontrast head CT. Critical Value/emergent results were called by telephone at the time of interpretation on 06/06/2016 at 11:52 am to Dr. Cinda Quest , who verbally acknowledged these results. Electronically Signed   By: Margarette Canada M.D.   On: 06/06/2016 11:53   Ct Angio Neck W And/or Wo Contrast  Result Date: 06/06/2016 CLINICAL DATA:  55 year old male code stroke. Slurred speech and dizziness. Initial encounter. EXAM: CT  ANGIOGRAPHY HEAD AND NECK TECHNIQUE: Multidetector CT imaging of the head and neck was performed using the standard protocol during bolus administration of intravenous contrast. Multiplanar CT image reconstructions and MIPs were obtained to evaluate the vascular anatomy. Carotid stenosis measurements (when applicable) are obtained utilizing NASCET criteria, using the distal internal carotid diameter as the denominator. CONTRAST:  75 mL Isovue 370 COMPARISON:  Head CT without contrast 1140 hours today. FINDINGS: CTA NECK Skeleton: Chronic right clavicle deformity. Degenerative changes in the spine. No acute osseous abnormality identified. Visualized paranasal sinuses and mastoids are stable and well pneumatized. Upper chest: Negative lung apices.  Negative superior mediastinum. Other neck: Subcentimeter thyroid nodules, calcified on the right, not meeting consensus  criteria for ultrasound follow-up. Larynx, pharynx, parapharyngeal spaces, retropharyngeal space, sublingual space, submandibular glands and parotid glands are within normal limits. Visualized orbits and scalp soft tissues are within normal limits. No cervical lymphadenopathy. Aortic arch: 3 vessel arch configuration.  No arch atherosclerosis. Right carotid system: Mild obscuration of the right brachiocephalic artery related to right subclavian vein contrast streak artifact. The brachiocephalic artery and right CCA origin appear normal. Mildly tortuous proximal right CCA. Minimal soft plaque at the right carotid bifurcation and right ICA origin. No stenosis. Left carotid system: Negative left CCA. Minimal soft plaque left carotid bifurcation. Negative cervical left ICA aside from mild tortuosity. Vertebral arteries: No proximal right subclavian artery or right vertebral artery origin stenosis. Negative cervical right vertebral artery. Mild soft plaque in the medial aspect of the proximal left subclavian artery without stenosis. There does appear to be soft  plaque at the left vertebral artery origin resulting in mild stenosis as seen on series 8, image 124. Mildly tortuous but otherwise negative cervical left vertebral artery. CTA HEAD Posterior circulation: No distal vertebral stenosis. Patent vertebrobasilar junction. Dominant appearing AICA is with patent origins. No basilar stenosis. Normal SCA and left PCA origin. Fetal type right PCA origin. Left posterior communicating artery diminutive or absent. PCA branches are within normal limits. Anterior circulation: Both ICA siphons are patent. Minimal to mild cavernous segment and supraclinoid calcified plaque without stenosis. Normal ophthalmic and right posterior communicating artery origins. Normal carotid termini, MCA and ACA origins. Slightly dominant left ACA A1 segment. Anterior communicating artery and bilateral ACA branches are normal. Left MCA M1 segment, bifurcation, and left MCA branches are within normal limits. Right MCA M1 segment, bifurcation, and right MCA branches are within normal limits. Venous sinuses: Patent. Anatomic variants: Fetal type right PCA origin. Delayed phase: Gray-white matter differentiation throughout the brain remains normal. No abnormal enhancement identified. Review of the MIP images confirms the above findings IMPRESSION: 1. Negative for emergent large vessel occlusion. 2. Minimal to mild carotid atherosclerosis without stenosis. Otherwise negative anterior circulation. 3. Mild soft plaque in the proximal left subclavian artery and at the left vertebral artery origin resulting in mild left vertebral origin stenosis. Otherwise negative posterior circulation. 4. Stable and normal CT appearance of the brain. 5. No acute findings in the neck. Electronically Signed   By: Genevie Ann M.D.   On: 06/06/2016 13:46    ROS:  CONSTITUTIONAL: Denies fevers, chills. Denies any fatigue, weakness.  EYES: Denies blurry vision, double vision, eye pain. EARS, NOSE, THROAT: Denies tinnitus, ear  pain, hearing loss. RESPIRATORY: Denies cough, wheeze, shortness of breath.  CARDIOVASCULAR: Denies chest pain, palpitations, edema.  GASTROINTESTINAL: Denies nausea, vomiting, diarrhea, abdominal pain. Denies bright red blood per rectum. GENITOURINARY: Denies dysuria, hematuria. ENDOCRINE: Denies nocturia or thyroid problems. HEMATOLOGIC AND LYMPHATIC: Denies easy bruising or bleeding. SKIN: Denies rash or lesion. MUSCULOSKELETAL: Denies pain in neck, back, shoulder, knees, hips or arthritic symptoms.  NEUROLOGIC: Denies paralysis, paresthesias.  PSYCHIATRIC: Denies anxiety or depressive symptoms. Blood pressure 120/68, pulse 62, resp. rate 17, height 5' 11"  (1.803 m), weight 113.4 kg (250 lb), SpO2 99 %.   PHYSICAL EXAMINATION:  GENERAL: Well-nourished, well-developed , currently in no acute distress.  HEAD: Normocephalic, atraumatic.  EYES: Pupils equal, round, and reactive to light. Extraocular muscles intact. No scleral icterus.  MOUTH: Moist mucosal membranes. Dentition intact. No abscess noted. EARS, NOSE, THROAT: Clear without exudates. No external lesions.  NECK: Supple. No thyromegaly. No nodules. No JVD.  PULMONARY: Clear to  auscultation bilaterally without wheezes, rales, or rhonchi. No use of accessory muscles. Good respiratory effort. CHEST: Nontender to palpation.  CARDIOVASCULAR: S1, S2, regular rate and rhythm. No murmurs, rubs, or gallops.  GASTROINTESTINAL: Soft, nontender, nondistended. No masses. Positive bowel sounds. No hepatosplenomegaly. MUSCULOSKELETAL: No swelling, clubbing, edema. Range of motion full in all extremities. NEUROLOGIC: Cranial nerves II-XII intact. No gross focal neurological deficits. Sensation intact. Reflexes intact. SKIN: No ulcerations, lesions, rash, cyanosis. Skin warm, dry. Turgor intact. PSYCHIATRIC: Mood, affect within normal limits. Patient awake, alert, oriented x 3. Insight and judgment intact.   Assessment/Plan:  #TIA Symptoms  are completely resolved CT head is negative CT angiogram of the head and neck are also negative  Plan is to continue his home medications aspirin, brilinta and statin.  Outpatient follow-up with cardiology dr.Khan as scheduled on December 6 to get outpatient echocardiogram op neurology follow-up in 2-3 weeks, patient is agreeable with the plan  #Essential hypertension Continue Coreg   #diabetes continue home medication metformin thousand milligrams by mouth twice a day  #History of coronary artery disease status post stents A symptomatic. Continue aspirin 81 mg, Coreg, Crestor, Brilinta Outpatient follow-up with cardiology Dr. Humphrey Rolls as scheduled on December 6 and get an echocardiogram  #hyperlipidemia continue home medication crestor  Discussed with ED physician Dr. Cinda Quest regarding the plan of care he is agreeable to discharge the patient from the emergency department  [place] THIS PATIENT: 60 minutes.   Plan discussed with the patient, his wife at bedside. RN was present during my entire discussion  Note: This dictation was prepared with Dragon dictation along with smaller phrase technology. Any transcriptional errors that result from this process are unintentional.   @[facility] @ Pager - [phone] 06/06/2016, 4:54 PM

## 2016-06-06 NOTE — Discharge Instructions (Signed)
First neurologist who saw you want to do to increase her aspirin to 325 mg once a day along with the blunt up. Please follow-up with neurology and your regular doctor as soon as possible. I would call neurology first thing on Monday. Return at once for any further slurry speech weakness numbness or any other complaints. I would also come back if he develop any large bruises.

## 2016-06-06 NOTE — Progress Notes (Signed)
[place] called nurse who advised not necessary to respond. [place] available for follow-up as needed.

## 2016-06-06 NOTE — Progress Notes (Signed)
Neurology:  Imaging personally reviewed.  Pt is back to baseline at this point in time He is maximally managed on ASA and Brilinta at this point in time due to cardiac reasons No significant hemodynamic intracranial stenosis. Pt can be d/c with follow up neurology 2-3 weeks Echo as out pt. Pt does see Cardiology and should follow up with them. Leotis Pain

## 2016-06-06 NOTE — ED Notes (Signed)
Called code stroke to [place] called [phone]

## 2016-06-06 NOTE — ED Provider Notes (Addendum)
[facility] Emergency Department Provider Note   ____________________________________________   First MD Initiated Contact with Patient 06/06/16 1146     (approximate)  I have reviewed the triage vital signs and the nursing notes.   HISTORY  Chief Complaint Dizziness    HPI Adam Patterson is a 55 y.o. male who is a [place]. 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 This happened approximately 9:00 this morning he ended up in the emergency room just now CT is negative patient feels back to normal. Patient patient had a recent heart attack and is taking aspirinand Brilinta. Patient also had an episode of sweating.   Past Medical History:  Diagnosis Date  . Abnormal liver enzymes 2006   no prior workup  . Diabetes mellitus   . Hyperlipidemia   . Hypertension   . Myocardial infarction   . Obesity   . Psoriasis    affecting hands (skin)  . Sleep apnea sleep study 2007   obstructive, not using mask    Patient Active Problem List   Diagnosis Date Noted  . Acute MI, inferior wall, initial episode of care ([place]) 02/04/2016  . CAD (coronary artery disease), native coronary artery 02/04/2016  . 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 Diabetes mellitus 03/13/2011  . Hypertension 03/13/2011  . Hyperlipidemia 03/13/2011  . Obesity 03/13/2011    Past Surgical History:  Procedure Laterality Date  . CARDIAC CATHETERIZATION    . CARDIAC CATHETERIZATION N/A 02/04/2016   Procedure: Left Heart Cath and Coronary Angiography;  Surgeon: Adrian Prows, MD;  Location: [place] [facility];  Service: Cardiovascular;  Laterality: N/A;  . CARDIAC CATHETERIZATION N/A 02/04/2016   Procedure: Coronary Stent Intervention;   Surgeon: Adrian Prows, MD;  Location: [place] [facility];  Service: Cardiovascular;  Laterality: N/A;  . HERNIA REPAIR      Prior to Admission medications   Medication Sig Start Date End Date Taking? Authorizing Provider  amLODipine-valsartan (EXFORGE) 10-320 MG tablet Take 0.5 tablets by mouth daily. 02/05/16  Yes Adrian Prows, MD  aspirin 81 MG tablet Take 81 mg by mouth daily.     Yes Historical Provider, MD  carvedilol (COREG) 3.125 MG tablet Take 1 tablet (3.125 mg total) by mouth 2 (two) times daily with a meal. 02/05/16  Yes Adrian Prows, MD  Cholecalciferol (VITAMIN D3) 2000 UNITS TABS Take 1 tablet by mouth daily.     Yes Historical Provider, MD  DULoxetine (CYMBALTA) 60 MG capsule Take 1 capsule (60 mg total) by mouth daily. 09/24/11  Yes Crecencio Mc, MD  fish oil-omega-3 fatty acids 1000 MG capsule Take 2 g by mouth 2 (two) times daily.     Yes Historical Provider, MD  FLUoxetine (PROZAC) 20 MG tablet Take 1 tablet by mouth daily. 10/30/15  Yes Historical Provider, MD  metFORMIN (GLUCOPHAGE) 1000 MG tablet Take 1 tablet (1,000 mg total) by mouth 2 (two) times daily. 02/06/16  Yes Adrian Prows, MD  nitroGLYCERIN (NITROSTAT) 0.4 MG SL tablet Place 1 tablet (0.4 mg total) under the tongue every 5 (five) minutes x 3 doses as needed for chest pain. 02/05/16  Yes Adrian Prows, MD  rosuvastatin (CRESTOR) 40 MG tablet Take 1  tablet by mouth daily. 12/17/15  Yes Historical Provider, MD  ticagrelor (BRILINTA) 90 MG TABS tablet Take 1 tablet (90 mg total) by mouth 2 (two) times daily. 02/05/16  Yes Adrian Prows, MD  Insulin Pen Needle (NOVOFINE) 30G X 8 MM MISC Patient test blood sugar two times a day. Patient not taking: Reported on 02/13/2016 04/17/11   Crecencio Mc, MD    Allergies Patient has no known allergies.  Family History  Problem Relation Age of Onset  . BRCA 1/2 Mother 8  . Diabetes Mother   . Diabetes Maternal Grandmother     Social History Social History  Substance Use Topics  . Smoking  status: Former Smoker    Types: Pipe    Quit date: 03/30/2010  . Smokeless tobacco: Never Used  . Alcohol use Yes    Review of Systems Constitutional: No fever/chills Eyes: No visual changes. ENT: No sore throat. Cardiovascular: Denies chest pain. Respiratory: Denies shortness of breath. Gastrointestinal: No abdominal pain.  No nausea, no vomiting.  No diarrhea.  No constipation. Genitourinary: Negative for dysuria. Musculoskeletal: Negative for back pain. Skin: Negative for rash. Neurological: Negative for headaches, focal weakness or numbness.  10-point ROS otherwise negative.  ____________________________________________   PHYSICAL EXAM:  VITAL SIGNS: ED Triage Vitals  Enc Vitals Group     BP 06/06/16 1129 130/72     Pulse Rate 06/06/16 1129 65     Resp 06/06/16 1129 19     Temp --      Temp src --      SpO2 06/06/16 1129 99 %     Weight 06/06/16 1129 250 lb (113.4 kg)     Height 06/06/16 1129 5' 11"  (1.803 m)     Head Circumference --      Peak Flow --      Pain Score 06/06/16 1131 0     Pain Loc --      Pain Edu? --      Excl. in [place]? --     Constitutional: Alert and oriented. Well appearing and in no acute distress. Eyes: Conjunctivae are normal. PERRL. EOMI. Head: Atraumatic. Nose: No congestion/rhinnorhea. Mouth/Throat: Mucous membranes are moist.  Oropharynx non-erythematous. Neck: No stridor.   Cardiovascular: Normal rate, regular rhythm. Grossly normal heart sounds.  Good peripheral circulation. Respiratory: Normal respiratory effort.  No retractions. Lungs CTAB. Gastrointestinal: Soft and nontender. No distention. No abdominal bruits. No CVA tenderness. Musculoskeletal: No lower extremity tenderness nor edema.  No joint effusions. Neurologic:  Normal speech and language. No gross focal neurologic deficits are appreciated. Specifically cranial nerves II through XII are intact finger-nose rapid alternating movements and hands are normal motor strength is  5 over 5 and sensation is intact throughou Skin:  Skin is warm, dry and intact. No rash noted. Psychiatric: Mood and affect are normal. Speech and behavior are normal.  ____________________________________________   LABS (all labs ordered are listed, but only abnormal results are displayed)  Labs Reviewed  CBC - Abnormal; Notable for the following:       Result Value   HCT 39.8 (*)    RDW 15.5 (*)    All other components within normal limits  DIFFERENTIAL - Abnormal; Notable for the following:    Neutro Abs 8.5 (*)    All other components within normal limits  COMPREHENSIVE METABOLIC PANEL - Abnormal; Notable for the following:    Potassium 3.4 (*)    Glucose, Bld 154 (*)    ALT 15 (*)  All other components within normal limits  TROPONIN I - Abnormal; Notable for the following:    Troponin I 0.04 (*)    All other components within normal limits  GLUCOSE, CAPILLARY - Abnormal; Notable for the following:    Glucose-Capillary 168 (*)    All other components within normal limits  PROTIME-INR  APTT  CBG MONITORING, ED   ____________________________________________  EKG  EKG read and interpreted by me shows normal sinus rhythm at a rate of 68 left axis deviation very poor R-wave progression and T-wave inversion in the lateral chest leads patient has had a history of prior MI ____________________________________________  RADIOLOGY  Study Result   CLINICAL DATA:  55 year old male with acute slurred speech and dizziness. Code stroke.  EXAM: CT HEAD WITHOUT CONTRAST  TECHNIQUE: Contiguous axial images were obtained from the base of the skull through the vertex without intravenous contrast.  COMPARISON:  None.  FINDINGS: Brain: No evidence of infarction, hemorrhage, hydrocephalus, extra-axial collection or mass lesion/mass effect.  Vascular: No hyperdense vessel or unexpected calcification.  Skull: Normal. Negative for fracture or focal  lesion.  Sinuses/Orbits: No acute finding.  Other: None.  IMPRESSION: Unremarkable noncontrast head CT.  Critical Value/emergent results were called by telephone at the time of interpretation on 06/06/2016 at 11:52 am to Dr. Cinda Quest , who verbally acknowledged these results.   Electronically Signed   By: Margarette Canada M.D.   On: 06/06/2016 11:53     ____________________________________________   PROCEDURES  Procedure(s) performed:  Procedures  Critical Care performed:   ____________________________________________   INITIAL IMPRESSION / ASSESSMENT AND PLAN / ED COURSE  Pertinent labs & imaging results that were available during my care of the patient were reviewed by me and considered in my medical decision making (see chart for details).    Clinical Course      ____________________________________________   FINAL CLINICAL IMPRESSION(S) / ED DIAGNOSES  Final diagnoses:  Transient cerebral ischemia, unspecified type      NEW MEDICATIONS STARTED DURING THIS VISIT:  New Prescriptions   No medications on file     Note:  This document was prepared using Dragon voice recognition software and may include unintentional dictation errors.    Nena Polio, MD 06/06/16 1220 Tell a neurology asked that he be admitted however patient does not want to stay in the hospital Dr. Terisa Starr and Dr. Macie Burows reviewed the chart and spoke to the patient and they will let him go home first neurologists for him to make his aspirin 325 mg a day. I will have him follow-up with neurology and his regular doctor. He is aware of my severe reservations on letting him go however since he has neurology acceptance for this plan I will allow him to go. He is aware that he could have another TIA or stroke.   Nena Polio, MD 06/06/16 [phone]

## 2016-06-06 NOTE — ED Triage Notes (Signed)
Pt reports some slurred speech.

## 2017-01-30 DIAGNOSIS — E785 Hyperlipidemia, unspecified: Secondary | ICD-10-CM | POA: Diagnosis not present

## 2017-01-30 DIAGNOSIS — F329 Major depressive disorder, single episode, unspecified: Secondary | ICD-10-CM | POA: Diagnosis not present

## 2017-01-30 DIAGNOSIS — E119 Type 2 diabetes mellitus without complications: Secondary | ICD-10-CM | POA: Diagnosis not present

## 2017-01-30 DIAGNOSIS — E559 Vitamin D deficiency, unspecified: Secondary | ICD-10-CM | POA: Diagnosis not present

## 2017-01-30 DIAGNOSIS — Z95818 Presence of other cardiac implants and grafts: Secondary | ICD-10-CM | POA: Diagnosis not present

## 2017-01-30 DIAGNOSIS — I10 Essential (primary) hypertension: Secondary | ICD-10-CM | POA: Diagnosis not present

## 2017-01-30 DIAGNOSIS — E7800 Pure hypercholesterolemia, unspecified: Secondary | ICD-10-CM | POA: Diagnosis not present

## 2017-01-30 LAB — TSH: TSH: 1.77 (ref 0.41–5.90)

## 2017-01-30 LAB — BASIC METABOLIC PANEL
CREATININE: 0.8 (ref 0.6–1.3)
Glucose: 306
Potassium: 3.9 (ref 3.4–5.3)
Sodium: 140 (ref 137–147)

## 2017-01-30 LAB — HEPATIC FUNCTION PANEL
ALK PHOS: 58 (ref 25–125)
ALT: 20 (ref 10–40)
AST: 28 (ref 14–40)
Bilirubin, Total: 0.7

## 2017-01-30 LAB — HEMOGLOBIN A1C: Hemoglobin A1C: 9.8

## 2017-01-30 LAB — VITAMIN D 25 HYDROXY (VIT D DEFICIENCY, FRACTURES): Vit D, 25-Hydroxy: 91

## 2017-01-30 LAB — CBC AND DIFFERENTIAL
HEMOGLOBIN: 15.1 (ref 13.5–17.5)
PLATELETS: 242 (ref 150–399)
WBC: 6.9

## 2017-01-30 LAB — PSA: PSA: 2.48

## 2017-01-30 LAB — LIPID PANEL
CHOLESTEROL: 130 (ref 0–200)
HDL: 64 (ref 35–70)
LDL CALC: 44
TRIGLYCERIDES: 112 (ref 40–160)

## 2017-03-05 ENCOUNTER — Ambulatory Visit: Payer: 59 | Admitting: Family Medicine

## 2017-03-09 ENCOUNTER — Encounter: Payer: Self-pay | Admitting: Family Medicine

## 2017-03-09 ENCOUNTER — Emergency Department
Admission: EM | Admit: 2017-03-09 | Discharge: 2017-03-09 | Disposition: A | Discharge status: Home / Self Care | Attending: Emergency Medicine | Admitting: Emergency Medicine

## 2017-03-09 ENCOUNTER — Ambulatory Visit (INDEPENDENT_AMBULATORY_CARE_PROVIDER_SITE_OTHER): Payer: 59 | Admitting: Family Medicine

## 2017-03-09 ENCOUNTER — Encounter: Payer: Self-pay | Admitting: Emergency Medicine

## 2017-03-09 ENCOUNTER — Emergency Department

## 2017-03-09 VITALS — BP 150/88 | HR 74 | Temp 98.0°F | Ht 68.75 in | Wt 276.0 lb

## 2017-03-09 DIAGNOSIS — G4733 Obstructive sleep apnea (adult) (pediatric): Secondary | ICD-10-CM | POA: Diagnosis not present

## 2017-03-09 DIAGNOSIS — E785 Hyperlipidemia, unspecified: Secondary | ICD-10-CM | POA: Diagnosis not present

## 2017-03-09 DIAGNOSIS — Y9389 Activity, other specified: Secondary | ICD-10-CM | POA: Diagnosis not present

## 2017-03-09 DIAGNOSIS — S99911A Unspecified injury of right ankle, initial encounter: Secondary | ICD-10-CM | POA: Diagnosis present

## 2017-03-09 DIAGNOSIS — Z6841 Body Mass Index (BMI) 40.0 and over, adult: Secondary | ICD-10-CM

## 2017-03-09 DIAGNOSIS — E118 Type 2 diabetes mellitus with unspecified complications: Secondary | ICD-10-CM

## 2017-03-09 DIAGNOSIS — Y929 Unspecified place or not applicable: Secondary | ICD-10-CM | POA: Insufficient documentation

## 2017-03-09 DIAGNOSIS — E1165 Type 2 diabetes mellitus with hyperglycemia: Secondary | ICD-10-CM

## 2017-03-09 DIAGNOSIS — I10 Essential (primary) hypertension: Secondary | ICD-10-CM

## 2017-03-09 DIAGNOSIS — L409 Psoriasis, unspecified: Secondary | ICD-10-CM

## 2017-03-09 DIAGNOSIS — X509XXA Other and unspecified overexertion or strenuous movements or postures, initial encounter: Secondary | ICD-10-CM | POA: Insufficient documentation

## 2017-03-09 DIAGNOSIS — S93491A Sprain of other ligament of right ankle, initial encounter: Secondary | ICD-10-CM | POA: Diagnosis not present

## 2017-03-09 DIAGNOSIS — I25110 Atherosclerotic heart disease of native coronary artery with unstable angina pectoris: Secondary | ICD-10-CM

## 2017-03-09 DIAGNOSIS — Y999 Unspecified external cause status: Secondary | ICD-10-CM | POA: Diagnosis not present

## 2017-03-09 MED ORDER — HYDROCODONE-ACETAMINOPHEN 5-325 MG PO TABS: 1.0000 | ORAL_TABLET | ORAL | 0 refills | Status: DC | PRN

## 2017-03-09 MED ORDER — IBUPROFEN 800 MG PO TABS
800.0000 mg | ORAL_TABLET | Freq: Once | ORAL | Status: AC
  Administered 2017-03-09: 800 mg via ORAL
  Filled 2017-03-09: qty 1

## 2017-03-09 NOTE — Assessment & Plan Note (Signed)
Chronic, remaining elevated although he is in pain today and was rushing to be seen. Will reassess at f/u visit, consider increased valsartan/amlodipine.

## 2017-03-09 NOTE — Assessment & Plan Note (Signed)
May discuss updated sleep study in the future.

## 2017-03-09 NOTE — Assessment & Plan Note (Addendum)
Chronic. Pt states he's had elevated readings in last few weeks. He reports compliance with metformin 1000mg  bid.  He doesn't remember what other injectable therapy he was previously on - will await prior records. Will ask him to return in 1 month with sugar log to review readings and titrate treatment accordingly.

## 2017-03-09 NOTE — Assessment & Plan Note (Signed)
S/p 3 stents placed Continue aspirin, statin, brillinta.

## 2017-03-09 NOTE — Patient Instructions (Addendum)
Return in 1 month for diabetes follow up visit. Check sugars 1 week prior to visit twice daily (fasting and 2 hours after a meal).  You can send me sugar readings through mychart when you sign up for this.  Sugar log printed out today.

## 2017-03-09 NOTE — ED Triage Notes (Signed)
Presents s/p fall states he stepped out curb and twisted right ankle  positive swelling

## 2017-03-09 NOTE — Discharge Instructions (Signed)
Rest, ice, and elevate the right leg often throughout the day. Follow up with podiatry. Return to the ER for symptoms that change or worsen if unable to schedule an appointment.

## 2017-03-09 NOTE — ED Provider Notes (Signed)
[facility] Emergency Department Provider Note ____________________________________________  Time seen: Approximately 5:42 PM  I have reviewed the triage vital signs and the nursing notes.   HISTORY  Chief Complaint Ankle Pain    HPI Adam Patterson is a 56 y.o. male who presents to the emergency department for evaluation and treatment of right ankle pain. Pain started today afterstepping out of the car on an uneven curb and his ankle twisted. He states that he heard a "crunch." No alleviating measures have been taken for this complaint. He denies previous ankle fracture.  Past Medical History:  Diagnosis Date  . Abnormal liver enzymes 2006   no prior workup  . Acute MI, inferior wall, initial episode of care ([place]) 02/04/2016  . Diabetes mellitus   . Hyperlipidemia   . Hypertension   . Myocardial infarction ([place])   . Obesity   . OSA (obstructive sleep apnea)    doesn't use machine. sleep study 2007  . Psoriasis    affecting hands (skin)    Patient Active Problem List   Diagnosis Date Noted  . Psoriasis 03/09/2017  . OSA (obstructive sleep apnea)   . CAD (coronary artery disease), native coronary artery 02/04/2016  . 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 Diabetes mellitus type 2, uncontrolled, with complications ([place]) 53/66/4403  . Hypertension 03/13/2011  . Hyperlipidemia 03/13/2011  . Body mass index (BMI) of 40.0-44.9 in adult [facility]) 03/13/2011    Past Surgical History:  Procedure Laterality Date  . CARDIAC CATHETERIZATION N/A 02/04/2016   Procedure: Left Heart Cath and Coronary Angiography;  Surgeon: Adrian Prows, MD;  Location: [place] [facility];  Service: Cardiovascular;  Laterality: N/A;  . CARDIAC CATHETERIZATION N/A 02/04/2016   3 stents placed ([place])  . HERNIA REPAIR    . TONSILLECTOMY    . VASECTOMY      Prior to Admission medications   Medication Sig Start Date End Date Taking? Authorizing Provider  amLODipine-valsartan  (EXFORGE) 10-320 MG tablet Take 0.5 tablets by mouth daily. 02/05/16   Adrian Prows, MD  aspirin 81 MG tablet Take 81 mg by mouth daily.      [provider]  carvedilol (COREG) 3.125 MG tablet Take 1 tablet (3.125 mg total) by mouth 2 (two) times daily with a meal. 02/05/16   Adrian Prows, MD  Cholecalciferol (VITAMIN D3) 2000 UNITS TABS Take 1 tablet by mouth daily.      [provider]  fish oil-omega-3 fatty acids 1000 MG capsule Take 2 g by mouth 2 (two) times daily.      [provider]  HYDROcodone-acetaminophen (NORCO/VICODIN) 5-325 MG tablet Take 1 tablet by mouth every 4 (four) hours as needed for moderate pain. 03/09/17 03/09/18  Azaylia Fong, Johnette Abraham B, FNP  metFORMIN (GLUCOPHAGE) 1000 MG tablet Take 1 tablet (1,000 mg total) by mouth 2 (two) times daily. 02/06/16   Adrian Prows, MD  nitroGLYCERIN (NITROSTAT) 0.4 MG SL tablet Place 1 tablet (0.4 mg total) under the tongue every 5 (five) minutes x 3 doses as needed for chest pain. 02/05/16   Adrian Prows, MD  rosuvastatin (CRESTOR) 40 MG tablet Take 1 tablet by mouth daily. 12/17/15   [provider]  ticagrelor (BRILINTA) 90 MG TABS tablet Take 1 tablet (90 mg total) by mouth 2 (two) times daily. 02/05/16   Adrian Prows, MD    Allergies Patient has no known allergies.  Family History  Problem Relation Age of Onset  . Diabetes Mother   . Breast cancer  Mother 35  . Diabetes Sister   . Muscular dystrophy Sister   . CAD Father   . Diabetes Brother   . Diabetes Sister     Social History Social History  Substance Use Topics  . Smoking status: Light Tobacco Smoker    Types: Pipe, Cigarettes    Last attempt to quit: 03/30/2010  . Smokeless tobacco: Never Used  . Alcohol use Yes     Comment: on weekends    Review of Systems Constitutional: Negative for recent illness. Cardiovascular: Negative for chest pain. Respiratory: Negative for shortness of breath. Musculoskeletal: Positive for right ankle pain. Skin:  Negative for open wound or lesion, specifically over the right foot or ankle.  Neurological: Right lower extremity negative for numbness or radiculopathy.  ____________________________________________   PHYSICAL EXAM:  VITAL SIGNS: ED Triage Vitals  Enc Vitals Group     BP 03/09/17 1737 (!) 144/91     Pulse Rate 03/09/17 1737 70     Resp 03/09/17 1737 20     Temp 03/09/17 1737 98.2 F (36.8 C)     Temp Source 03/09/17 1737 Oral     SpO2 03/09/17 1737 98 %     Weight 03/09/17 1732 276 lb (125.2 kg)     Height 03/09/17 1732 5' 8.75" (1.746 m)     Head Circumference --      Peak Flow --      Pain Score 03/09/17 1738 7     Pain Loc --      Pain Edu? --      Excl. in [place]? --     Constitutional: Alert and oriented. Well appearing and in no acute distress. Eyes: Conjunctivae are clear without discharge or drainage.  Head: Atraumatic Neck: Active range of motion observed. Respiratory: Respirations even and unlabored. Musculoskeletal: [place] ankle rules are negative. No frank laxity of the joint. Patient is able to perform range of motion of the right ankle although unable to fully flex and extend the foot secondary to pain. Compartments of the right ankle are soft. Neurologic: Sharp and dull sensation intact, specifically over the right lower extremity.  Skin: Intact, soft tissue swelling present over the proximal, dorsal aspect of the foot  Psychiatric: Behavior and affect are appropriate.  ____________________________________________   LABS (all labs ordered are listed, but only abnormal results are displayed)  Labs Reviewed - No data to display ____________________________________________  RADIOLOGY  Soft tissue swelling without acute bony abnormality of the right ankle per radiology. ____________________________________________   PROCEDURES  Procedure(s) performed: Ankle stirrup splint applied by ER tech. Patient neurovascularly intact  post-application.  ____________________________________________   INITIAL IMPRESSION / ASSESSMENT AND PLAN / ED COURSE  Adam Patterson is a 56 y.o. male who presents to the emergency department for treatment and evaluation of right ankle injury. Exam and images are consistent with ankle sprain. Ankle stirrup splint was applied and the patient was advised to schedule a follow-up appointment with podiatry. He was instructed to rest, ice, and elevate the extremity often throughout the day until the pain has resolved or until he sees the podiatrist. He was instructed to return to the emergency department for symptoms that change or worsen if he is unable to see his primary care provider or the specialist.  Pertinent labs & imaging results that were available during my care of the patient were reviewed by me and considered in my medical decision making (see chart for details).  _________________________________________   FINAL CLINICAL IMPRESSION(S) / ED  DIAGNOSES  Final diagnoses:  Sprain of anterior talofibular ligament of right ankle, initial encounter    Discharge Medication List as of 03/09/2017  6:25 PM    START taking these medications   Details  HYDROcodone-acetaminophen (NORCO/VICODIN) 5-325 MG tablet Take 1 tablet by mouth every 4 (four) hours as needed for moderate pain., Starting Mon 03/09/2017, Until Tue 03/09/2018, Print        If controlled substance prescribed during this visit, 12 month history viewed on the [place] prior to issuing an initial prescription for Schedule II or III opiod.    Victorino Dike, FNP 03/09/17 Parker, [place], MD 03/11/17 [phone]

## 2017-03-09 NOTE — Progress Notes (Addendum)
BP (!) 150/88   Pulse 74   Temp 98 F (36.7 C) (Oral)   Ht 5' 8.75" (1.746 m)   Wt 276 lb (125.2 kg)   SpO2 97%   BMI 41.06 kg/m    CC: new pt to establish Subjective:    Patient ID: Adam Patterson, male    DOB: September 28, 1960, 56 y.o.   MRN: [id number]  HPI: Adam Patterson is a 56 y.o. male presenting on 03/09/2017 for [place] friend of Hermelinda Medicus.  Prior seen at [facility], last seen there 03/2011. Then [place] last CPE 12/2016. Records requested today.   Financial difficulties this past year. Wants to avoid donut hole at all costs.   ER visit for transient cerebral ischemia - pt declined hospitalization, did not follow up with neurology. Fluoxetine and duloxetine have not been effective and actually caused worsening anxiety/paranoia.   Recent R ankle injury at work - planning to see worker's comp.   Siblings have had gastric band surgery.   HTN - bp at home elevated as well. No HA, vision changes, CP/tightness, SOB, leg swelling. Intermittent headaches treats with aleve.  HLD - on crestor 40mg  daily and fish oil 2gm BID. Tolerating well without myalgias.  DM - on metformin 1000mg  bid. Has been on victoza and novolog in the past. Last A1c was too high. Aviva glucose meter at home.  CAD s/p stent placement 01/2016 sees Dr Humphrey Rolls cardiologist. On aspirin and brillinta.  OSA - sleep study done 2007. Not using CPAP machine. Was unable to afford prior sleep study (done at out of network facility).   Psoriasis - does better when working out regularly.   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 Interim medical history since our last visit reviewed. Allergies and medications reviewed and updated. Outpatient Medications Prior to Visit  Medication Sig Dispense Refill  . amLODipine-valsartan (EXFORGE) 10-320 MG tablet Take 0.5 tablets by mouth daily. 30 tablet 3  . aspirin 81 MG tablet Take 81 mg by mouth daily.      . carvedilol (COREG) 3.125 MG tablet Take 1 tablet (3.125 mg total) by mouth 2 (two) times daily with a meal. 60 tablet 0  . Cholecalciferol (VITAMIN D3) 2000 UNITS TABS Take 1 tablet by mouth daily.      . fish oil-omega-3 fatty acids 1000 MG capsule Take 2 g by mouth 2 (two) times daily.      . metFORMIN (GLUCOPHAGE) 1000 MG tablet Take 1 tablet (1,000 mg total) by mouth 2 (two) times daily.  3  . nitroGLYCERIN (NITROSTAT) 0.4 MG SL tablet Place 1 tablet (0.4 mg total) under the tongue every 5 (five) minutes x 3 doses as needed for chest pain. 25 tablet 1  . rosuvastatin (CRESTOR) 40 MG tablet Take 1 tablet by mouth daily.  3  . ticagrelor (BRILINTA) 90 MG TABS tablet Take 1 tablet (90 mg total) by mouth 2 (two) times daily. 60 tablet 11  . DULoxetine (CYMBALTA) 60 MG capsule Take 1 capsule (60 mg total) by mouth daily. 30 capsule 3  . FLUoxetine (PROZAC) 20 MG tablet Take 1  tablet by mouth daily.  3  . Insulin Pen Needle (NOVOFINE) 30G X 8 MM MISC Patient test blood sugar two times a day. (Patient not taking: Reported on 03/09/2017) 100 each 6   No facility-administered medications prior to visit.      Per HPI unless specifically indicated in ROS section below Review of Systems     Objective:    BP (!) 150/88   Pulse 74   Temp 98 F (36.7 C) (Oral)   Ht 5' 8.75" (1.746 m)   Wt 276 lb (125.2 kg)   SpO2 97%   BMI 41.06 kg/m   Wt Readings from Last 3 Encounters:  03/09/17 276 lb (125.2 kg)  03/09/17 276 lb (125.2 kg)  06/06/16 250 lb (113.4 kg)    Physical Exam  Constitutional: He appears well-developed and well-nourished. No distress.  HENT:    Head: Normocephalic and atraumatic.  Mouth/Throat: Oropharynx is clear and moist. No oropharyngeal exudate.  Eyes: Pupils are equal, round, and reactive to light. Conjunctivae are normal.  Neck: Normal range of motion. Neck supple.  Cardiovascular: Normal rate, regular rhythm, normal heart sounds and intact distal pulses.   No murmur heard. Pulmonary/Chest: Effort normal and breath sounds normal. No respiratory distress. He has no wheezes. He has no rales.  Musculoskeletal:  Antalgic gait after recent R ankle injury  Lymphadenopathy:    He has no cervical adenopathy.  Skin: Skin is warm and dry. No rash noted.  Psychiatric: He has a normal mood and affect.  Nursing note and vitals reviewed.     Assessment & Plan:  He will seek care through worker's comp for recent R ankle injury sustained at work today. Declines evaluation in office today for this.  Problem List Items Addressed This Visit    Body mass index (BMI) of 40.0-44.9 in adult [facility])    Encouraged healthy diet choices and restarting regular exercise regimen.      CAD (coronary artery disease), native coronary artery    S/p 3 stents placed Continue aspirin, statin, brillinta.      Diabetes mellitus type 2, uncontrolled, with complications ([place]) - Primary    Chronic. Pt states he's had elevated readings in last few weeks. He reports compliance with metformin 1000mg  bid.  He doesn't remember what other injectable therapy he was previously on - will await prior records. Will ask him to return in 1 month with sugar log to review readings and titrate treatment accordingly.       Hyperlipidemia    Chronic, stable on crestor and fish oil. Continue current regimen. Will await latest labs from 2 months ago.      Hypertension    Chronic, remaining elevated although he is in pain today and was rushing to be seen. Will reassess at f/u visit, consider increased valsartan/amlodipine.       OSA (obstructive sleep apnea)    May  discuss updated sleep study in the future.       Psoriasis       Follow up plan: Return in about 4 weeks (around 04/06/2017) for follow up visit.  Ria Bush, MD

## 2017-03-09 NOTE — Assessment & Plan Note (Signed)
Encouraged healthy diet choices and restarting regular exercise regimen.

## 2017-03-09 NOTE — Assessment & Plan Note (Signed)
Chronic, stable on crestor and fish oil. Continue current regimen. Will await latest labs from 2 months ago.

## 2017-03-14 HISTORY — PX: CARDIOVASCULAR STRESS TEST: SHX262

## 2017-03-16 ENCOUNTER — Emergency Department: Payer: 59

## 2017-03-16 ENCOUNTER — Encounter: Payer: Self-pay | Admitting: Emergency Medicine

## 2017-03-16 ENCOUNTER — Emergency Department
Admission: EM | Admit: 2017-03-16 | Discharge: 2017-03-16 | Disposition: A | Discharge status: Home / Self Care | Payer: 59 | Attending: Emergency Medicine | Admitting: Emergency Medicine

## 2017-03-16 DIAGNOSIS — R079 Chest pain, unspecified: Secondary | ICD-10-CM | POA: Insufficient documentation

## 2017-03-16 DIAGNOSIS — E119 Type 2 diabetes mellitus without complications: Secondary | ICD-10-CM | POA: Insufficient documentation

## 2017-03-16 DIAGNOSIS — Z79899 Other long term (current) drug therapy: Secondary | ICD-10-CM | POA: Insufficient documentation

## 2017-03-16 DIAGNOSIS — E876 Hypokalemia: Secondary | ICD-10-CM | POA: Insufficient documentation

## 2017-03-16 DIAGNOSIS — R0789 Other chest pain: Secondary | ICD-10-CM | POA: Diagnosis not present

## 2017-03-16 DIAGNOSIS — I2510 Atherosclerotic heart disease of native coronary artery without angina pectoris: Secondary | ICD-10-CM | POA: Insufficient documentation

## 2017-03-16 DIAGNOSIS — Z7982 Long term (current) use of aspirin: Secondary | ICD-10-CM | POA: Diagnosis not present

## 2017-03-16 LAB — BASIC METABOLIC PANEL
Anion gap: 8 (ref 5–15)
BUN: 12 mg/dL (ref 6–20)
CHLORIDE: 100 mmol/L — AB (ref 101–111)
CO2: 28 mmol/L (ref 22–32)
CREATININE: 0.81 mg/dL (ref 0.61–1.24)
Calcium: 9.2 mg/dL (ref 8.9–10.3)
GFR calc Af Amer: >60 mL/min (ref >60)
GFR calc non Af Amer: >60 mL/min (ref >60)
GLUCOSE: 221 mg/dL — AB (ref 65–99)
Potassium: 3.1 mmol/L — ABNORMAL LOW (ref 3.5–5.1)
Sodium: 136 mmol/L (ref 135–145)

## 2017-03-16 LAB — CBC
HCT: 40 % (ref 40.0–52.0)
Hemoglobin: 13.9 g/dL (ref 13.0–18.0)
MCH: 28.9 pg (ref 26.0–34.0)
MCHC: 34.7 g/dL (ref 32.0–36.0)
MCV: 83.2 fL (ref 80.0–100.0)
PLATELETS: 210 10*3/uL (ref 150–440)
RBC: 4.8 MIL/uL (ref 4.40–5.90)
RDW: 15.3 % — ABNORMAL HIGH (ref 11.5–14.5)
WBC: 6.8 10*3/uL (ref 3.8–10.6)

## 2017-03-16 LAB — TROPONIN I: TROPONIN I: 0.03 ng/mL — AB (ref <0.03)

## 2017-03-16 MED ORDER — NITROGLYCERIN 2 % TD OINT: 1.0000 [in_us] | TOPICAL_OINTMENT | Freq: Once | TRANSDERMAL | Status: DC

## 2017-03-16 MED ORDER — NITROGLYCERIN 2 % TD OINT
1.0000 [in_us] | TOPICAL_OINTMENT | Freq: Once | TRANSDERMAL | Status: AC
  Administered 2017-03-16: 1 [in_us] via TOPICAL
  Filled 2017-03-16: qty 1

## 2017-03-16 MED ORDER — POTASSIUM CHLORIDE CRYS ER 20 MEQ PO TBCR
40.0000 meq | EXTENDED_RELEASE_TABLET | Freq: Once | ORAL | Status: AC
  Administered 2017-03-16: 40 meq via ORAL
  Filled 2017-03-16: qty 2

## 2017-03-16 MED ORDER — ASPIRIN 81 MG PO CHEW
243.0000 mg | CHEWABLE_TABLET | Freq: Once | ORAL | Status: AC
  Administered 2017-03-16: 243 mg via ORAL
  Filled 2017-03-16: qty 3

## 2017-03-16 NOTE — ED Notes (Signed)
Pt given coke and meal tray, ok per Dr. Reita Cliche

## 2017-03-16 NOTE — ED Notes (Addendum)
Dr. Reita Cliche notified in person of pts. troponin 0.03

## 2017-03-16 NOTE — ED Triage Notes (Signed)
Pt to ed with c/o chest pain that started about 15 min pta.  Pt reports hx of MI and stent placement.  Pt reports pain is similar to chest pain in the past.

## 2017-03-16 NOTE — ED Notes (Signed)
Pt alert and oriented X4, active, cooperative, pt in NAD. RR even and unlabored, color WNL.  Pt informed to return if any life threatening symptoms occur.   

## 2017-03-16 NOTE — Discharge Instructions (Signed)
You were evaluated for chest discomfort, and although no certain cause was found, your exam and evaluation are reassuring in the emergency department today.  Return to the emergency room immediately for any worsening chest pain, trouble breathing, sweats, vomiting, or any other symptoms concerning to you.  Please see your cardiologist within the next 1-2 days.

## 2017-03-16 NOTE — ED Provider Notes (Signed)
[facility] Emergency Department Provider Note ____________________________________________   I have reviewed the triage vital signs and the triage nursing note.  HISTORY  Chief Complaint Chest Pain   Historian Patient  HPI Adam Patterson is a 56 y.o. 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 Currently chest discomforthas eased off quite a bit.  No shortness of breath. No nausea without vomiting. No abdominal pain.    Past Medical History:  Diagnosis Date  . Abnormal liver enzymes 2006   no prior workup  . Acute MI, inferior wall, initial episode of care ([place]) 02/04/2016  . Diabetes mellitus   . Hyperlipidemia   . Hypertension   . Myocardial infarction ([place])   . Obesity   . OSA (obstructive sleep apnea)    doesn't use machine. sleep study 2007  . Psoriasis    affecting hands (skin)    Patient Active Problem List   Diagnosis Date Noted  . Psoriasis 03/09/2017  . OSA (obstructive sleep apnea)   . CAD (coronary artery disease), native coronary artery 02/04/2016  . 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 Diabetes mellitus type 2, uncontrolled, with complications ([name]) 56/43/3295  . Hypertension 03/13/2011  . Hyperlipidemia 03/13/2011  . Body mass index (BMI) of 40.0-44.9 in adult [facility]) 03/13/2011    Past Surgical History:  Procedure Laterality Date  . CARDIAC CATHETERIZATION N/A 02/04/2016   Procedure: Left Heart Cath and Coronary Angiography;  Surgeon: Adrian Prows, MD;  Location: [place] [facility];  Service: Cardiovascular;  Laterality: N/A;  . CARDIAC CATHETERIZATION N/A 02/04/2016   3 stents placed ([place])  . HERNIA REPAIR    . TONSILLECTOMY    . VASECTOMY      Prior to  Admission medications   Medication Sig Start Date End Date Taking? Authorizing Provider  aspirin 81 MG tablet Take 81 mg by mouth daily.     Yes [provider]  carvedilol (COREG) 3.125 MG tablet Take 1 tablet (3.125 mg total) by mouth 2 (two) times daily with a meal. 02/05/16  Yes Adrian Prows, MD  Cholecalciferol (VITAMIN D3) 2000 UNITS TABS Take 2,000 Units by mouth 2 (two) times daily.    Yes [provider]  Cyanocobalamin (B-12 PO) Take 1 tablet by mouth 2 (two) times daily.   Yes [provider]  fish oil-omega-3 fatty acids 1000 MG capsule Take 1 g by mouth 2 (two) times daily.    Yes [provider]  metFORMIN (GLUCOPHAGE) 1000 MG tablet Take 1 tablet (1,000 mg total) by mouth 2 (two) times daily. 02/06/16  Yes Adrian Prows, MD  nitroGLYCERIN (NITROSTAT) 0.4 MG SL tablet Place 1 tablet (0.4 mg total) under the tongue every 5 (five) minutes x 3 doses as needed for chest pain. 02/05/16  Yes Adrian Prows, MD  rosuvastatin (CRESTOR) 40 MG tablet Take 1 tablet by mouth daily. 12/17/15  Yes [provider]  ticagrelor (BRILINTA) 90 MG TABS tablet Take 1 tablet (90 mg total) by mouth 2 (two) times daily. 02/05/16  Yes Adrian Prows, MD  amLODipine-valsartan (EXFORGE) 10-320 MG tablet Take 0.5 tablets by mouth daily. Patient not taking: Reported on 03/16/2017 02/05/16   Adrian Prows, MD  HYDROcodone-acetaminophen (NORCO/VICODIN) 5-325 MG tablet Take  1 tablet by mouth every 4 (four) hours as needed for moderate pain. Patient not taking: Reported on 03/16/2017 03/09/17 03/09/18  Victorino Dike, FNP    No Known Allergies  Family History  Problem Relation Age of Onset  . Diabetes Mother   . Breast cancer Mother 26  . Diabetes Sister   . Muscular dystrophy Sister   . CAD Father   . Diabetes Brother   . Diabetes Sister     Social History Social History  Substance Use Topics  . Smoking status: Light Tobacco Smoker    Types: Pipe, Cigarettes    Last attempt to  quit: 03/30/2010  . Smokeless tobacco: Never Used  . Alcohol use Yes     Comment: on weekends    Review of Systems  Constitutional: Negative for fever. Eyes: Negative for visual changes. ENT: Negative for sore throat. Cardiovascular: Positive for chest pain. Respiratory: Negative for shortness of breath. Gastrointestinal: Negative for abdominal pain, vomiting and diarrhea. Genitourinary: Negative for dysuria. Musculoskeletal: Negative for back pain. Skin: Negative for rash. Neurological: Negative for headache.  ____________________________________________   PHYSICAL EXAM:  VITAL SIGNS: ED Triage Vitals  Enc Vitals Group     BP 03/16/17 0912 (!) 206/96     Pulse Rate 03/16/17 0912 62     Resp 03/16/17 0912 18     Temp --      Temp Source 03/16/17 0912 Oral     SpO2 03/16/17 0912 98 %     Weight 03/16/17 0912 276 lb (125.2 kg)     Height --      Head Circumference --      Peak Flow --      Pain Score 03/16/17 0911 5     Pain Loc --      Pain Edu? --      Excl. in [place]? --      Constitutional: Alert and oriented. Well appearing and in no distress. HEENT   Head: Normocephalic and atraumatic.      Eyes: Conjunctivae are normal. Pupils equal and round.       Ears:         Nose: No congestion/rhinnorhea.   Mouth/Throat: Mucous membranes are moist.   Neck: No stridor. Cardiovascular/Chest: Normal rate, regular rhythm.  No murmurs, rubs, or gallops. Respiratory: Normal respiratory effort without tachypnea nor retractions. Breath sounds are clear and equal bilaterally. No wheezes/rales/rhonchi. Gastrointestinal: Soft. No distention, no guarding, no rebound. Nontender.    Genitourinary/rectal:Deferred Musculoskeletal: Nontender with normal range of motion in all extremities. No joint effusions.  No lower extremity tenderness.  No edema. Neurologic:  Normal speech and language. No gross or focal neurologic deficits are appreciated. Skin:  Skin is warm, dry and  intact. No rash noted. Psychiatric: Mood and affect are normal. Speech and behavior are normal. Patient exhibits appropriate insight and judgment.   ____________________________________________  LABS (pertinent positives/negatives)  Labs Reviewed  BASIC METABOLIC PANEL - Abnormal; Notable for the following:       Result Value   Potassium 3.1 (*)    Chloride 100 (*)    Glucose, Bld 221 (*)    All other components within normal limits  CBC - Abnormal; Notable for the following:    RDW 15.3 (*)    All other components within normal limits  TROPONIN I - Abnormal; Notable for the following:    Troponin I 0.03 (*)    All other components within normal limits  TROPONIN I    ____________________________________________  EKG I, Lisa Roca, MD, the attending physician have personally viewed and interpreted all ECGs.  59 bpm. Sinus bradycardia. Left axis deviation. Nonspecific ST and T-wave ____________________________________________  RADIOLOGY All Xrays were viewed by me. Imaging interpreted by Radiologist.  Chest x-ray:  IMPRESSION: Stable mild changes of chronic bronchitis and/or asthma. No acute cardiopulmonary disease.  __________________________________________  PROCEDURES  Procedure(s) performed: None  Critical Care performed: None  ____________________________________________   ED COURSE / ASSESSMENT AND PLAN  Pertinent labs & imaging results that were available during my care of the patient were reviewed by me and considered in my medical decision making (see chart for details).   Patient reports last time he had symptoms like this he was found to have a heart attack and required 3 stents. Symptoms have now eased off. I will give him nitroglycerin paste. He already had 81 mg asa, will give remainder.  Initial EKG is reassuring. Initial troponin 0.03. No ongoing chest pain.  Discussed overnight observation vs. 4 hour repeat troponin in ER and DC to  follow up with his cardiologist in 48 hours.  Patient and spouse would like to go home if at all possible.  Upon negative repeat testing, patient will be discharged home.    CONSULTATIONS:   [facility], Dr. Einar Gip by phone - Discussed clinical history and evaluation and is agreeable to receive phone call from the patient tomorrow to be seen within 1-2 days in the office.   Patient / Family / Caregiver informed of clinical course, medical decision-making process, and agree with plan.   I discussed return precautions, follow-up instructions, and discharge instructions with patient and/or family.  Discharge Instructions : You were evaluated for chest discomfort, and although no certain cause was found, your exam and evaluation are reassuring in the emergency department today.  Return to the emergency room immediately for any worsening chest pain, trouble breathing, sweats, vomiting, or any other symptoms concerning to you.  Please see your cardiologist within the next 1-2 days.  ___________________________________________   FINAL CLINICAL IMPRESSION(S) / ED DIAGNOSES   Final diagnoses:  Nonspecific chest pain  Hypokalemia              Note: This dictation was prepared with Dragon dictation. Any transcriptional errors that result from this process are unintentional    Lisa Roca, MD 03/16/17 1416

## 2017-03-17 DIAGNOSIS — E782 Mixed hyperlipidemia: Secondary | ICD-10-CM | POA: Diagnosis not present

## 2017-03-17 DIAGNOSIS — I209 Angina pectoris, unspecified: Secondary | ICD-10-CM | POA: Diagnosis not present

## 2017-03-17 DIAGNOSIS — I10 Essential (primary) hypertension: Secondary | ICD-10-CM | POA: Diagnosis not present

## 2017-03-17 DIAGNOSIS — I25119 Atherosclerotic heart disease of native coronary artery with unspecified angina pectoris: Secondary | ICD-10-CM | POA: Diagnosis not present

## 2017-03-20 DIAGNOSIS — I10 Essential (primary) hypertension: Secondary | ICD-10-CM | POA: Diagnosis not present

## 2017-03-20 DIAGNOSIS — I25119 Atherosclerotic heart disease of native coronary artery with unspecified angina pectoris: Secondary | ICD-10-CM | POA: Diagnosis not present

## 2017-03-20 DIAGNOSIS — I209 Angina pectoris, unspecified: Secondary | ICD-10-CM | POA: Diagnosis not present

## 2017-03-20 DIAGNOSIS — E119 Type 2 diabetes mellitus without complications: Secondary | ICD-10-CM | POA: Diagnosis not present

## 2017-03-26 DIAGNOSIS — H524 Presbyopia: Secondary | ICD-10-CM | POA: Diagnosis not present

## 2017-03-28 ENCOUNTER — Encounter: Payer: Self-pay | Admitting: Family Medicine

## 2017-03-28 DIAGNOSIS — I5020 Unspecified systolic (congestive) heart failure: Secondary | ICD-10-CM | POA: Insufficient documentation

## 2017-03-28 LAB — HM DIABETES EYE EXAM

## 2017-04-01 ENCOUNTER — Encounter: Payer: Self-pay | Admitting: Family Medicine

## 2017-04-07 ENCOUNTER — Encounter: Payer: Self-pay | Admitting: Family Medicine

## 2017-04-09 DIAGNOSIS — I25119 Atherosclerotic heart disease of native coronary artery with unspecified angina pectoris: Secondary | ICD-10-CM | POA: Diagnosis not present

## 2017-04-09 DIAGNOSIS — I209 Angina pectoris, unspecified: Secondary | ICD-10-CM | POA: Diagnosis not present

## 2017-04-09 DIAGNOSIS — I10 Essential (primary) hypertension: Secondary | ICD-10-CM | POA: Diagnosis not present

## 2017-04-15 ENCOUNTER — Ambulatory Visit (INDEPENDENT_AMBULATORY_CARE_PROVIDER_SITE_OTHER): Payer: 59 | Admitting: Family Medicine

## 2017-04-15 ENCOUNTER — Encounter: Payer: Self-pay | Admitting: Family Medicine

## 2017-04-15 VITALS — BP 120/62 | HR 59 | Temp 97.8°F | Wt 280.2 lb

## 2017-04-15 DIAGNOSIS — IMO0002 Reserved for concepts with insufficient information to code with codable children: Secondary | ICD-10-CM

## 2017-04-15 DIAGNOSIS — I5022 Chronic systolic (congestive) heart failure: Secondary | ICD-10-CM

## 2017-04-15 DIAGNOSIS — E785 Hyperlipidemia, unspecified: Secondary | ICD-10-CM

## 2017-04-15 DIAGNOSIS — Z23 Encounter for immunization: Secondary | ICD-10-CM

## 2017-04-15 DIAGNOSIS — E118 Type 2 diabetes mellitus with unspecified complications: Secondary | ICD-10-CM

## 2017-04-15 DIAGNOSIS — I10 Essential (primary) hypertension: Secondary | ICD-10-CM

## 2017-04-15 DIAGNOSIS — E1165 Type 2 diabetes mellitus with hyperglycemia: Secondary | ICD-10-CM | POA: Diagnosis not present

## 2017-04-15 DIAGNOSIS — M25511 Pain in right shoulder: Secondary | ICD-10-CM | POA: Insufficient documentation

## 2017-04-15 NOTE — Assessment & Plan Note (Signed)
Chronic, continue statin and fish oil.  Update dLDL (not fasting today).

## 2017-04-15 NOTE — Assessment & Plan Note (Signed)
Chronic. Update A1c today. Recent eye exam, he did previously complete DSME. Forgot sugar log. Elevated recall cbg's despite compliance with metformin. He thinks he was previously on victoza as well - low threshold to restart.

## 2017-04-15 NOTE — Patient Instructions (Addendum)
Flu shot today. Pneumovax today.  Sign release of information form for records from [facility] practice (last few years) and from Dr Gloriann Loan eye exam (last month).  Labs today then we will decide additional medicine.  Check into mychart options to send images/files.  I think you have persistent rotator cuff injury and bursitis. Treat with exercises provided today, and resistance band. Heating pad to shoulder. If not improving, consider steroid shot into shoulder.  Return in 3 months for physical.

## 2017-04-15 NOTE — Assessment & Plan Note (Signed)
Chronic, stable. Continue carvedilol.

## 2017-04-15 NOTE — Assessment & Plan Note (Signed)
Exam consistent with RTC injury (anticipate chronic tendonitis and possible partial supraspinatus tear) and subdeltoid bursitis. rec heating pad. Avoid NSAIDs in CAD hx. Provided with resistance band and recommended stretching exercises from [facility] pt advisor on bursitis and RTC injury. Update if not improving, consider steroid injection.
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
## 2017-04-16 DIAGNOSIS — H0012 Chalazion right lower eyelid: Secondary | ICD-10-CM | POA: Diagnosis not present

## 2017-04-16 LAB — LDL CHOLESTEROL, DIRECT: Direct LDL: 45 mg/dL

## 2017-04-16 LAB — MICROALBUMIN / CREATININE URINE RATIO
CREATININE, U: 179.7 mg/dL
MICROALB UR: 16 mg/dL — AB (ref 0.0–1.9)
Microalb Creat Ratio: 8.9 mg/g (ref 0.0–30.0)

## 2017-04-16 LAB — HEMOGLOBIN A1C: Hgb A1c MFr Bld: 9.6 % — ABNORMAL HIGH (ref 4.6–6.5)

## 2017-04-21 ENCOUNTER — Encounter: Payer: Self-pay | Admitting: Family Medicine

## 2017-04-22 ENCOUNTER — Encounter: Payer: Self-pay | Admitting: Family Medicine

## 2017-04-22 ENCOUNTER — Other Ambulatory Visit: Payer: Self-pay | Admitting: Family Medicine

## 2017-04-22 DIAGNOSIS — I10 Essential (primary) hypertension: Secondary | ICD-10-CM | POA: Diagnosis not present

## 2017-04-22 DIAGNOSIS — E782 Mixed hyperlipidemia: Secondary | ICD-10-CM | POA: Diagnosis not present

## 2017-04-22 DIAGNOSIS — I25119 Atherosclerotic heart disease of native coronary artery with unspecified angina pectoris: Secondary | ICD-10-CM | POA: Diagnosis not present

## 2017-04-22 DIAGNOSIS — I209 Angina pectoris, unspecified: Secondary | ICD-10-CM | POA: Diagnosis not present

## 2017-04-22 MED ORDER — LIRAGLUTIDE 18 MG/3ML ~~LOC~~ SOPN: PEN_INJECTOR | SUBCUTANEOUS | 3 refills | Status: DC

## 2017-04-29 ENCOUNTER — Encounter: Payer: Self-pay | Admitting: Family Medicine

## 2017-05-06 DIAGNOSIS — I209 Angina pectoris, unspecified: Secondary | ICD-10-CM | POA: Diagnosis not present

## 2017-05-10 DIAGNOSIS — I209 Angina pectoris, unspecified: Secondary | ICD-10-CM | POA: Diagnosis present

## 2017-05-10 NOTE — H&P (Signed)
OFFICE VISIT NOTES COPIED TO EPIC FOR DOCUMENTATION  . History of Present Illness Laverda Page MD; 04/23/2017 9:15 PM) Patient words: Last OV 03/17/2017; FU nuc and echo.  The patient is a 56 year old male who presents for a follow-up for Coronary artery disease. Patient with known coronary artery disease when he presented with cardiac arrest and inferior wall myocardial infarction on 02/04/2016, has 3 drug-eluting stents placed to the dominant RCA. He has history of hypertension, hyperlipidemia and diabetes mellitus. He presented to the emergency room on 03/16/2017 with exertional chest pain, by the time he presented he was chest pain-free, cardiac markers were negative and he was discharged home. He underwent echocardiogram and nuclear stress test in September 2018 and presents here for follow-up. States that he had extreme difficulty on the treadmill and had severe chest pain that took his breath away. Otherwise states that he is doing well. He is compliant with taking medications including Brilinta.  He is presently not having any chest pain, states that he has not gone back to doing his usual activities as he is afraid of symptoms. He has not had to use any sublingual nitroglycerin since ED visit.   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 Mild concentric remodeling of the left ventricle. Normal global wall motion. Visual EF is 55-60%. Normal diastolic filling pattern. Calculated EF 60%. Left atrial cavity is mildly dilated at 4.2 cm. Structurally normal tricuspid valve with no regurgitation noted. Unable to estimate PA  pressure. The aortic root size in the upper limit of normal at 3.8 cm. Mixed hyperlipidemia (E78.2)  Laboratory examination (Z01.89)  Labs 03/16/2017: Potassium 3.1, BUN 12, creatinine 0.81, EGFR greater than 60. Hb 13.9/HCT 40.0, platelets 21 and 10. Chest x-ray PA and lateral view: Bronchitis and/or asthma, no active disease Morbid obesity due to excess calories (E66.01)  Atherosclerosis of native coronary artery of native heart with angina pectoris (I25.119) [02/04/2016]: EKG 03/17/2017:NSR @ 59 bpm, inferior infarct old. Cannot exclude posterior infarct old. T-wave abnormality V4 through 6, cannot exclude lateral ischemia. Normal QT interval. Exercise sestamibi stress test 03/20/2017: 1. The resting electrocardiogram demonstrated normal sinus rhythm, normal resting conduction and no resting arrhythmias. Inferior and lateral infarct, old. Non specific ST-T change. The stress electrocardiogram was negative for ischemia. Patient exercised on Bruce protocol for 8:00 minutes and achieved 10.11 METS. Stress test terminated due to chest pain, dyspnea, and 86% MPHR achieved (Target HR >85%). Hypertenisve BP esponse with peak BP 212/90 mm Hg. 2. Left ventricular cavity is noted to be enlarged on the rest and stress studies at 178 mL. There is a large area of infarction in the basal inferior, mid inferior, apical inferior and apical myocardial wall(s). Gated SPECT imaging demonstrates akinesis of the basal inferior, mid inferior, apical inferior and apical myocardial wall(s). The left ventricular ejection fraction was calculatedto be 34%. This is a high risk study. Bursitis (M71.9)  Rotator cuff injury (U88.280K)   Allergies (April Harrington; 04/22/2017 9:12 AM) No Known Drug Allergies [03/17/2017]:  Family History (April Harrington; 04/22/2017 9:12 AM) Mother  Living, DM2, no heart issues Father  Living, heart issues Sister 3  1 older, 2 younger, no heart issues Brother 1  younger, no heart  issues  Social History (April Harrington; 04/22/2017 9:38 AM) Current tobacco use  Never smoker. Alcohol Use  Occasional alcohol use. beer Marital status  Married. Living Situation  Lives with spouse. Number of Children  3.  Past Surgical History (April Harrington; 04/22/2017 9:12 AM) Tonsillectomy [1975]:  Medication History (April Harrington; 04/22/2017 9:40 AM) AmLODIPine Besylate (10MG Tablet, 1 (one) Tablet Oral daily, Taken starting 03/17/2017) Active. Losartan Potassium-HCTZ (100-25MG Tablet, 1 (one) Tablet Oral every morning, Taken starting 03/17/2017) Active. (Discontinue Lisinopril) Carvedilol (6.25MG Tablet, 1 Tablet Oral two times daily, Taken starting 03/17/2017) Active. Aspirin (81MG Tablet DR, 1 Oral daily) Active. Vitamin B12 (1 Oral two times daily) Specific strength unknown - Active. MetFORMIN HCl (1000MG Tablet, 1 Oral two times daily) Active. Rosuvastatin Calcium (40MG Tablet, 1 Oral daily) Active. Brilinta (90MG Tablet, 1 Oral two times daily) Active. Fish Oil (1000MG Capsule, 1 Oral two times daily) Active. Vitamin D3 (1000UNIT Capsule, 2 Oral two times daily) Active. Nitroglycerin (0.4MG Tab Sublingual, Sublingual as needed) Active. Neomycin-Polymyxin-Dexameth (3.5-10000-0.1 Suspension, Ophthalmic four times daily) Active. Medications Reconciled (verbally with pt; no list or medication present)  Diagnostic Studies History Anderson Malta Sergeant; 04/22/2017 8:20 AM) Echocardiogram [04/09/2017]: Left ventricle cavity is normal in size. Mild concentric remodeling of the left ventricle. Normal global wall motion. Visual EF is 55-60%. Normal diastolic filling pattern. Calculated EF 60%. Left atrial cavity is mildly dilated at 4.2 cm. Structurally normal tricuspid valve with no regurgitation noted. Unable to estimate PA pressure. The aortic root size in the upper limit of normal at 3.8 cm. Nuclear stress test [03/20/2017]: 1. The resting  electrocardiogram demonstrated normal sinus rhythm, normal resting conduction and no resting arrhythmias. Inferior and lateral infarct, old. Non specific ST-T change. The stress electrocardiogram was negative for ischemia. Patient exercised on Bruce protocol for 8:00 minutes and achieved 10.11 METS. Stress test terminated due to chest pain, dyspnea, and 86% MPHR achieved (Target HR >85%). Hypertenisve BP esponse with peak BP 212/90 mm Hg. 2. Left ventricular cavity is noted to be enlarged on the rest and stress studies at 178 mL. There is a large area of infarction in the basal inferior, mid inferior, apical inferior and apical myocardial wall(s). Gated SPECT imaging demonstrates akinesis of the basal inferior, mid inferior, apical inferior and apical myocardial wall(s). The left ventricular ejection fraction was calculatedto be 34%. This is a high risk study.    Review of Systems Laverda Page, MD; 04/23/2017 9:14 PM) General Not Present- Appetite Loss and Weight Gain. Respiratory Present- Difficulty Breathing on Exertion. Not Present- Chronic Cough and Wakes up from Sleep Wheezing or Short of Breath. Cardiovascular Present- Chest Pain and Palpitations (Occasional palpitations Brief). Gastrointestinal Not Present- Black, Tarry Stool and Difficulty Swallowing. Musculoskeletal Not Present- Decreased Range of Motion and Muscle Atrophy. Neurological Not Present- Attention Deficit. Psychiatric Not Present- Personality Changes and Suicidal Ideation. Endocrine Not Present- Cold Intolerance and Heat Intolerance. Hematology Not Present- Abnormal Bleeding. All other systems negative  Vitals (April Harrington; 04/22/2017 9:41 AM) 04/22/2017 9:28 AM Weight: 278.44 lb Height: 69in Body Surface Area: 2.38 m Body Mass Index: 41.12 kg/m  Pulse: 55 (Regular)  P.OX: 98% (Room air) BP: 120/88 (Sitting, Left Arm, Standard)       Physical Exam Laverda Page, MD; 04/23/2017 9:14  PM) General Mental Status-Alert. General Appearance-Cooperative, Appears stated age, Not in acute distress. Build & Nutrition-Well built and Morbidly obese.  Head and Neck Neck -Note: Short neck and difficult to evaluate JVD.  Thyroid  Gland Characteristics - no palpable nodules, no palpable enlargement.  Cardiovascular Cardiovascular examination reveals -normal heart sounds, regular rate and rhythm with no murmurs. Inspection Jugular vein - Right - No Distention.  Abdomen Inspection Contour - Obese and Pannus present. Palpation/Percussion Normal exam - Non Tender and No hepatosplenomegaly. Auscultation Normal exam - Bowel sounds normal.  Peripheral Vascular Lower Extremity Inspection - Bilateral - Inspection Normal. Palpation - Edema - Bilateral - No edema. Femoral pulse - Bilateral - Feeble(Pulsus difficult to feel due to patient's bodily habitus.), No Bruits. Popliteal pulse - Bilateral - Feeble(Pulsus difficult to feel due to patient's bodily habitus.). Dorsalis pedis pulse - Bilateral - Normal. Posterior tibial pulse - Bilateral - Normal. Carotid arteries - Bilateral-No Carotid bruit.  Neurologic Neurologic evaluation reveals -alert and oriented x 3 with no impairment of recent or remote memory. Motor-Grossly intact without any focal deficits.  Musculoskeletal Global Assessment Left Lower Extremity - normal range of motion without pain. Right Lower Extremity - normal range of motion without pain.    Assessment & Plan Laverda Page MD; 04/23/2017 9:16 PM) Atherosclerosis of native coronary artery of native heart with angina pectoris (I25.119) Story: EKG 03/17/2017:NSR @ 59 bpm, inferior infarct old. Cannot exclude posterior infarct old. T-wave abnormality V4 through 6, cannot exclude lateral ischemia. Normal QT interval.  Exercise sestamibi stress test 03/20/2017: 1. The resting electrocardiogram demonstrated normal sinus rhythm, normal resting  conduction and no resting arrhythmias. Inferior and lateral infarct, old. Non specific ST-T change. The stress electrocardiogram was negative for ischemia. Patient exercised on Bruce protocol for 8:00 minutes and achieved 10.11 METS. Stress test terminated due to chest pain, dyspnea, and 86% MPHR achieved (Target HR >85%). Hypertenisve BP esponse with peak BP 212/90 mm Hg. 2. Left ventricular cavity is noted to be enlarged on the rest and stress studies at 178 mL. There is a large area of infarction in the basal inferior, mid inferior, apical inferior and apical myocardial wall(s). Gated SPECT imaging demonstrates akinesis of the basal inferior, mid inferior, apical inferior and apical myocardial wall(s). The left ventricular ejection fraction was calculatedto be 34%. This is a high risk study. Impression: Coronary angiogram 02/04/2016: Anomalous origin of left main from the right coronary cusp. RCA proximal, distal 3 overlapping stents 2.5 x 26, 3.5 x 28 and 3.5 x 18 resolute for acute inferior MI. 60% mid circumflex and 50% mid LAD stenosis. Angina pectoris (I20.9) Future Plans 07/37/1062: METABOLIC PANEL, BASIC (69485) - one time 05/04/2017: CBC & PLATELETS (AUTO) (46270) - one time 05/04/2017: PT (PROTHROMBIN TIME) (35009) - one time Benign essential hypertension (I10) Story: Echocardiogram 04/09/2017: Left ventricle cavity is normal in size. Mild concentric remodeling of the left ventricle. Normal global wall motion. Visual EF is 55-60%. Normal diastolic filling pattern. Calculated EF 60%. Left atrial cavity is mildly dilated at 4.2 cm. Structurally normal tricuspid valve with no regurgitation noted. Unable to estimate PA pressure. The aortic root size in the upper limit of normal at 3.8 cm. Mixed hyperlipidemia (E78.2) Morbid obesity due to excess calories (E66.01) Laboratory examination (F81.82) Story: 05/06/2017: Glucose 251, creatinine 1.08, EGFR 76/88, potassium 3.4, BMP otherwise normal.   Hemoglobin 12.8, CBC otherwise normal.  INR 1.0, prothrombin time 10.8.  Labs 03/16/2017: Potassium 3.1, BUN 12, creatinine 0.81, EGFR greater than 60. Hb 13.9/HCT 40.0, platelets 21 and 10. A1c 9.6%, direct LDL 45.  Note:- Recommendations:  He is here on a four-week follow-up of chest pain suggestive of angina pectoris and coronary artery disease, doing stress  testing she did have exertional chest pain with a treadmill. Also the posterior portion reveals very large inferior scar, very mild peri-infarct ischemia. However in view of normal LV systolic function by echocardiogram, I suspect the inferior wall abnormalities probably hibernating. High risk stress test in a patient with uncontrolled hypertension and diabetes mellitus, I have recommended that we proceed with coronary angiography. Schedule for cardiac catheterization, and possible angioplasty. We discussed regarding risks, benefits, alternatives to this including stress testing, CTA and continued medical therapy. Patient wants to proceed. Understands <1-2% risk of death, stroke, MI, urgent CABG, bleeding, infection, renal failure but not limited to these.  Bariatric program: I have discussed with the patient regarding the program to consider this and make effort to contact them. Information given to the patient about the program and eligibility. Office visit after the tests.  CC: Dr. Alver Fisher; Kaylyn Lim, MD

## 2017-05-12 ENCOUNTER — Encounter (HOSPITAL_COMMUNITY): Payer: Self-pay

## 2017-05-12 ENCOUNTER — Other Ambulatory Visit: Payer: Self-pay

## 2017-05-12 ENCOUNTER — Ambulatory Visit (HOSPITAL_COMMUNITY)
Admission: RE | Admit: 2017-05-12 | Discharge: 2017-05-12 | Disposition: A | Discharge status: Home / Self Care | Payer: 59 | Source: Ambulatory Visit | Attending: Cardiology | Admitting: Cardiology

## 2017-05-12 ENCOUNTER — Encounter (HOSPITAL_COMMUNITY)
Admission: RE | Disposition: A | Discharge status: Home / Self Care | Payer: Self-pay | Source: Ambulatory Visit | Attending: Cardiology

## 2017-05-12 DIAGNOSIS — Z8674 Personal history of sudden cardiac arrest: Secondary | ICD-10-CM | POA: Diagnosis not present

## 2017-05-12 DIAGNOSIS — I209 Angina pectoris, unspecified: Secondary | ICD-10-CM | POA: Diagnosis present

## 2017-05-12 DIAGNOSIS — E782 Mixed hyperlipidemia: Secondary | ICD-10-CM | POA: Insufficient documentation

## 2017-05-12 DIAGNOSIS — Z7982 Long term (current) use of aspirin: Secondary | ICD-10-CM | POA: Diagnosis not present

## 2017-05-12 DIAGNOSIS — Z7984 Long term (current) use of oral hypoglycemic drugs: Secondary | ICD-10-CM | POA: Diagnosis not present

## 2017-05-12 DIAGNOSIS — E119 Type 2 diabetes mellitus without complications: Secondary | ICD-10-CM | POA: Insufficient documentation

## 2017-05-12 DIAGNOSIS — I252 Old myocardial infarction: Secondary | ICD-10-CM | POA: Insufficient documentation

## 2017-05-12 DIAGNOSIS — I25118 Atherosclerotic heart disease of native coronary artery with other forms of angina pectoris: Secondary | ICD-10-CM | POA: Diagnosis not present

## 2017-05-12 DIAGNOSIS — R0602 Shortness of breath: Secondary | ICD-10-CM | POA: Diagnosis not present

## 2017-05-12 DIAGNOSIS — Z6841 Body Mass Index (BMI) 40.0 and over, adult: Secondary | ICD-10-CM | POA: Diagnosis not present

## 2017-05-12 DIAGNOSIS — Z79899 Other long term (current) drug therapy: Secondary | ICD-10-CM | POA: Insufficient documentation

## 2017-05-12 DIAGNOSIS — I10 Essential (primary) hypertension: Secondary | ICD-10-CM | POA: Diagnosis not present

## 2017-05-12 HISTORY — PX: LEFT HEART CATH AND CORONARY ANGIOGRAPHY: CATH118249

## 2017-05-12 LAB — GLUCOSE, CAPILLARY
GLUCOSE-CAPILLARY: 207 mg/dL — AB (ref 65–99)
GLUCOSE-CAPILLARY: 177 mg/dL — AB (ref 65–99)

## 2017-05-12 LAB — POTASSIUM: POTASSIUM: 3.2 mmol/L — AB (ref 3.5–5.1)

## 2017-05-12 SURGERY — LEFT HEART CATH AND CORONARY ANGIOGRAPHY
Anesthesia: LOCAL

## 2017-05-12 MED ORDER — SODIUM CHLORIDE 0.9% FLUSH: 3.0000 mL | Freq: Two times a day (BID) | INTRAVENOUS | Status: DC

## 2017-05-12 MED ORDER — SPIRONOLACTONE 25 MG PO TABS: 25.0000 mg | ORAL_TABLET | Freq: Every morning | ORAL | 3 refills | Status: DC

## 2017-05-12 MED ORDER — LIDOCAINE HCL (PF) 1 % IJ SOLN
INTRAMUSCULAR | Status: DC | PRN
  Administered 2017-05-12: 2 mL

## 2017-05-12 MED ORDER — POTASSIUM CHLORIDE CRYS ER 20 MEQ PO TBCR
20.0000 meq | EXTENDED_RELEASE_TABLET | Freq: Once | ORAL | Status: AC
Start: 2017-05-12 — End: 2017-05-12
  Administered 2017-05-12: 20 meq via ORAL

## 2017-05-12 MED ORDER — POTASSIUM CHLORIDE CRYS ER 20 MEQ PO TBCR
EXTENDED_RELEASE_TABLET | ORAL | Status: AC
  Administered 2017-05-12: 20 meq via ORAL
  Filled 2017-05-12: qty 1

## 2017-05-12 MED ORDER — HEPARIN (PORCINE) IN NACL 2-0.9 UNIT/ML-% IJ SOLN
INTRAMUSCULAR | Status: AC | PRN
  Administered 2017-05-12: 1000 mL via INTRA_ARTERIAL

## 2017-05-12 MED ORDER — IOPAMIDOL (ISOVUE-370) INJECTION 76%
INTRAVENOUS | Status: AC
  Filled 2017-05-12: qty 100

## 2017-05-12 MED ORDER — HEPARIN (PORCINE) IN NACL 2-0.9 UNIT/ML-% IJ SOLN
INTRAMUSCULAR | Status: AC
  Filled 2017-05-12: qty 1000

## 2017-05-12 MED ORDER — NITROGLYCERIN 1 MG/10 ML FOR IR/CATH LAB
INTRA_ARTERIAL | Status: AC
  Filled 2017-05-12: qty 10

## 2017-05-12 MED ORDER — VERAPAMIL HCL 2.5 MG/ML IV SOLN
INTRAVENOUS | Status: DC | PRN
  Administered 2017-05-12: 14:00:00 via INTRA_ARTERIAL

## 2017-05-12 MED ORDER — SODIUM CHLORIDE 0.9 % WEIGHT BASED INFUSION
3.0000 mL/kg/h | INTRAVENOUS | Status: DC
  Administered 2017-05-12: 3 mL/kg/h via INTRAVENOUS

## 2017-05-12 MED ORDER — SODIUM CHLORIDE 0.9 % IV SOLN: 250.0000 mL | INTRAVENOUS | Status: DC | PRN

## 2017-05-12 MED ORDER — HYDROMORPHONE HCL 1 MG/ML IJ SOLN
INTRAMUSCULAR | Status: AC
  Filled 2017-05-12: qty 0.5

## 2017-05-12 MED ORDER — ASPIRIN 81 MG PO CHEW
CHEWABLE_TABLET | ORAL | Status: AC
  Administered 2017-05-12: 81 mg via ORAL
  Filled 2017-05-12: qty 1

## 2017-05-12 MED ORDER — HEPARIN SODIUM (PORCINE) 1000 UNIT/ML IJ SOLN
INTRAMUSCULAR | Status: AC
  Filled 2017-05-12: qty 1

## 2017-05-12 MED ORDER — LIDOCAINE HCL 2 % IJ SOLN
INTRAMUSCULAR | Status: AC
  Filled 2017-05-12: qty 20

## 2017-05-12 MED ORDER — MIDAZOLAM HCL 2 MG/2ML IJ SOLN
INTRAMUSCULAR | Status: DC | PRN
  Administered 2017-05-12: 2 mg via INTRAVENOUS

## 2017-05-12 MED ORDER — SODIUM CHLORIDE 0.9 % WEIGHT BASED INFUSION: 1.0000 mL/kg/h | INTRAVENOUS | Status: DC

## 2017-05-12 MED ORDER — HEPARIN SODIUM (PORCINE) 1000 UNIT/ML IJ SOLN
INTRAMUSCULAR | Status: DC | PRN
  Administered 2017-05-12: 7000 [IU] via INTRAVENOUS

## 2017-05-12 MED ORDER — MIDAZOLAM HCL 2 MG/2ML IJ SOLN
INTRAMUSCULAR | Status: AC
  Filled 2017-05-12: qty 2

## 2017-05-12 MED ORDER — SODIUM CHLORIDE 0.9% FLUSH: 3.0000 mL | INTRAVENOUS | Status: DC | PRN

## 2017-05-12 MED ORDER — ASPIRIN 81 MG PO CHEW
81.0000 mg | CHEWABLE_TABLET | ORAL | Status: AC
  Administered 2017-05-12: 81 mg via ORAL

## 2017-05-12 MED ORDER — METFORMIN HCL 1000 MG PO TABS: 1000.0000 mg | ORAL_TABLET | Freq: Two times a day (BID) | ORAL | 3 refills | Status: DC

## 2017-05-12 MED ORDER — HYDROMORPHONE HCL 1 MG/ML IJ SOLN
INTRAMUSCULAR | Status: DC | PRN
  Administered 2017-05-12: 0.5 mg via INTRAVENOUS

## 2017-05-12 MED ORDER — VERAPAMIL HCL 2.5 MG/ML IV SOLN
INTRAVENOUS | Status: AC
  Filled 2017-05-12: qty 2

## 2017-05-12 SURGICAL SUPPLY — 11 items
CATH INFINITI 5FR ANG PIGTAIL (CATHETERS) ×2 IMPLANT
CATH OPTITORQUE TIG 4.0 5F (CATHETERS) ×2 IMPLANT
DEVICE RAD COMP TR BAND LRG (VASCULAR PRODUCTS) ×2 IMPLANT
GLIDESHEATH SLEND A-KIT 6F 20G (SHEATH) ×2 IMPLANT
GUIDEWIRE INQWIRE 1.5J.035X260 (WIRE) ×1 IMPLANT
INQWIRE 1.5J .035X260CM (WIRE) ×2
KIT HEART LEFT (KITS) ×2 IMPLANT
PACK CARDIAC CATHETERIZATION (CUSTOM PROCEDURE TRAY) ×2 IMPLANT
SYR MEDRAD MARK V 150ML (SYRINGE) ×2 IMPLANT
TRANSDUCER W/STOPCOCK (MISCELLANEOUS) ×2 IMPLANT
TUBING CIL FLEX 10 FLL-RA (TUBING) ×2 IMPLANT

## 2017-05-12 NOTE — Interval H&P Note (Signed)
History and Physical Interval Note:  05/12/2017 1:58 PM  Adam Patterson  has presented today for surgery, with the diagnosis of cp, cad  The various methods of treatment have been discussed with the patient and family. After consideration of risks, benefits and other options for treatment, the patient has consented to  Procedure(s): LEFT HEART CATH AND CORONARY ANGIOGRAPHY (N/A) and possible angioplasty  as a surgical intervention .  The patient's history has been reviewed, patient examined, no change in status, stable for surgery.  I have reviewed the patient's chart and labs.  Questions were answered to the patient's satisfaction.   Symptom Status: Ischemic Symptoms Non-invasive Testing: High risk If no or indeterminate stress test, FFR/iFR results in all diseased vessels: N/A Diabetes Mellitus: Yes S/P CABG: No Antianginal therapy (number of long-acting drugs): >=2 Patient undergoing renal transplant: No Patient undergoing percutaneous valve procedure: No   1 Vessel Disease No proximal LAD involvement, No proximal left dominant LCX involvement  PCI: A (8);  Indication 2  CABG: M (6);  Indication 2 Proximal left dominant LCX involvement  PCI: A (8);  Indication 5  CABG: A (8);  Indication 5 Proximal LAD involvement  PCI: A (8);  Indication 5  CABG: A (8);  Indication 5  2 Vessel Disease No proximal LAD involvement  PCI: A (8);  Indication 8  CABG: A (7);  Indication 8 Proximal LAD involvement  PCI: A (8);  Indication 14  CABG: A (9);  Indication 14  3 Vessel Disease Low disease complexity (e.g., focal stenoses, SYNTAX <=22)  PCI: A (7);  Indication 19  CABG: A (9);  Indication 19 Intermediate or high disease complexity (e.g., SYNTAX >=23)  PCI: M (6);  Indication 23  CABG: A (9);  Indication 23  Left Main Disease Isolated LMCA disease: ostial or midshaft  PCI: A (7);  Indication 24  CABG: A (9);  Indication 24 Isolated LMCA disease: bifurcation involvement  PCI:  M (6);  Indication 25  CABG: A (9);  Indication 25 LMCA ostial or midshaft, concurrent low disease burden multivessel disease (e.g., 1-2 additional focal stenoses, SYNTAX <=22)  PCI: A (7);  Indication 26  CABG: A (9);  Indication 26 LMCA ostial or midshaft, concurrent intermediate or high disease burden multivessel disease (e.g., 1-2 additional bifurcation stenoses, long stenoses, SYNTAX >=23)  PCI: M (4);  Indication 27  CABG: A (9);  Indication 27 LMCA bifurcation involvement, concurrent low disease burden multivessel disease (e.g., 1-2 additional focal stenoses, SYNTAX <=22)  PCI: M (6);  Indication 28  CABG: A (9);  Indication 28 LMCA bifurcation involvement, concurrent intermediate or high disease burden multivessel disease (e.g., 1-2 additional bifurcation stenoses, long stenoses, SYNTAX >=23)  PCI: R (3);  Indication 29  CABG: A (9);  Indication 29  Notes:  A indicates appropriate. M indicates may be appropriate. R indicates rarely appropriate. Number in parentheses is median score for that indication. Reclassify indicates number of functionally diseased vessels should be decreased given negative FFR/iFR. Re-evaluate the scenario interpreting any FFR/iFR negative vessel as being not significantly stenosed.  Disease means involved vessel provides flow to a sufficient amount of myocardium to be clinically important.  If FFR testing indicates a vessel is not significant, that vessel should not be considered diseased (and the patient should be reclassified with respect to extent of functionally significant disease).  Proximal LAD + proximal left dominant LCX is considered 3 vessel CAD  2 Vessel CAD with FFR/iFR abnormal in only 1 but  not both is considered 1 vessel CAD  Disease complexity includes occlusion, bifurcation, trifurcation, ostial, >20 mm, tortuosity, calcification, thrombus  LMCA disease is >=50% by angiography, MLD <2.8 mm, MLA <6 mm2; MLA 6-7.5 mm2 requires further  physiologic  See Table B for risk stratification based on noninvasive testing  Journal of the [employer] of Cardiology Mar 2017, 23391; DOI: 10.1016/j.[email or web address] [email or web address].full-text.pdf This App  2018 by the Society for Cardiovascular Angiography and Interventions   Adrian Prows

## 2017-05-12 NOTE — Discharge Instructions (Signed)
Moderate Conscious Sedation, Adult, Care After These instructions provide you with information about caring for yourself after your procedure. Your health care provider may also give you more specific instructions. Your treatment has been planned according to current medical practices, but problems sometimes occur. Call your health care provider if you have any problems or questions after your procedure. What can I expect after the procedure? After your procedure, it is common:  To feel sleepy for several hours.  To feel clumsy and have poor balance for several hours.  To have poor judgment for several hours.  To vomit if you eat too soon.  Follow these instructions at home: For at least 24 hours after the procedure:   Do not: ? Participate in activities where you could fall or become injured. ? Drive. ? Use heavy machinery. ? Drink alcohol. ? Take sleeping pills or medicines that cause drowsiness. ? Make important decisions or sign legal documents. ? Take care of children on your own.  Rest. Eating and drinking  Follow the diet recommended by your health care provider.  If you vomit: ? Drink water, juice, or soup when you can drink without vomiting. ? Make sure you have little or no nausea before eating solid foods. General instructions  Have a responsible adult stay with you until you are awake and alert.  Take over-the-counter and prescription medicines only as told by your health care provider.  If you smoke, do not smoke without supervision.  Keep all follow-up visits as told by your health care provider. This is important. Contact a health care provider if:  You keep feeling nauseous or you keep vomiting.  You feel light-headed.  You develop a rash.  You have a fever. Get help right away if:  You have trouble breathing. This information is not intended to replace advice given to you by your health care provider. Make sure you discuss any questions you have  with your health care provider. Document Released: 04/20/2013 Document Revised: 12/03/2015 Document Reviewed: 10/20/2015 Elsevier Interactive Patient Education  2018 [employer] Radial Site Care Refer to this sheet in the next few weeks. These instructions provide you with information about caring for yourself after your procedure. Your health care provider may also give you more specific instructions. Your treatment has been planned according to current medical practices, but problems sometimes occur. Call your health care provider if you have any problems or questions after your procedure. What can I expect after the procedure? After your procedure, it is typical to have the following:  Bruising at the radial site that usually fades within 1-2 weeks.  Blood collecting in the tissue (hematoma) that may be painful to the touch. It should usually decrease in size and tenderness within 1-2 weeks.  Follow these instructions at home:  Take medicines only as directed by your health care provider.  You may shower 24-48 hours after the procedure or as directed by your health care provider. Remove the bandage (dressing) and gently wash the site with plain soap and water. Pat the area dry with a clean towel. Do not rub the site, because this may cause bleeding.  Do not take baths, swim, or use a hot tub until your health care provider approves.  Check your insertion site every day for redness, swelling, or drainage.  Do not apply powder or lotion to the site.  Do not flex or bend the affected arm for 24 hours or as directed by your health care provider.  Do not   push or pull heavy objects with the affected arm for 24 hours or as directed by your health care provider.  Do not lift over 10 lb (4.5 kg) for 5 days after your procedure or as directed by your health care provider.  Ask your health care provider when it is okay to: ? Return to work or school. ? Resume usual physical activities or  sports. ? Resume sexual activity.  Do not drive home if you are discharged the same day as the procedure. Have someone else drive you.  You may drive 24 hours after the procedure unless otherwise instructed by your health care provider.  Do not operate machinery or power tools for 24 hours after the procedure.  If your procedure was done as an outpatient procedure, which means that you went home the same day as your procedure, a responsible adult should be with you for the first 24 hours after you arrive home.  Keep all follow-up visits as directed by your health care provider. This is important. Contact a health care provider if:  You have a fever.  You have chills.  You have increased bleeding from the radial site. Hold pressure on the site. Get help right away if:  You have unusual pain at the radial site.  You have redness, warmth, or swelling at the radial site.  You have drainage (other than a small amount of blood on the dressing) from the radial site.  The radial site is bleeding, and the bleeding does not stop after 30 minutes of holding steady pressure on the site.  Your arm or hand becomes pale, cool, tingly, or numb. This information is not intended to replace advice given to you by your health care provider. Make sure you discuss any questions you have with your health care provider. Document Released: 08/02/2010 Document Revised: 12/06/2015 Document Reviewed: 01/16/2014 Elsevier Interactive Patient Education  2018 [employer]  

## 2017-05-13 ENCOUNTER — Encounter (HOSPITAL_COMMUNITY): Payer: Self-pay | Admitting: Cardiology

## 2017-05-13 MED FILL — Lidocaine HCl Local Inj 2%: INTRAMUSCULAR | Qty: 20 | Status: AC

## 2017-05-13 MED FILL — Nitroglycerin IV Soln 100 MCG/ML in D5W: INTRA_ARTERIAL | Qty: 10 | Status: AC

## 2017-05-13 NOTE — Telephone Encounter (Signed)
plz fill out PA for victoza for patient - he is already on metformin. Form in Lisa's box.

## 2017-05-14 ENCOUNTER — Encounter: Payer: Self-pay | Admitting: Family Medicine

## 2017-05-14 NOTE — Telephone Encounter (Signed)
Reviewing PA form, pt needs to try bydureon, trulicity or byetta prior to [place]. Will see which pt prefers to try.

## 2017-05-20 DIAGNOSIS — Q245 Malformation of coronary vessels: Secondary | ICD-10-CM | POA: Diagnosis not present

## 2017-05-20 DIAGNOSIS — I25119 Atherosclerotic heart disease of native coronary artery with unspecified angina pectoris: Secondary | ICD-10-CM | POA: Diagnosis not present

## 2017-05-20 DIAGNOSIS — E782 Mixed hyperlipidemia: Secondary | ICD-10-CM | POA: Diagnosis not present

## 2017-05-25 ENCOUNTER — Encounter: Payer: Self-pay | Admitting: Family Medicine

## 2017-06-07 ENCOUNTER — Encounter: Payer: Self-pay | Admitting: Family Medicine

## 2017-06-07 DIAGNOSIS — M19011 Primary osteoarthritis, right shoulder: Secondary | ICD-10-CM | POA: Insufficient documentation

## 2017-06-07 DIAGNOSIS — I6529 Occlusion and stenosis of unspecified carotid artery: Secondary | ICD-10-CM | POA: Insufficient documentation

## 2017-07-23 ENCOUNTER — Other Ambulatory Visit (HOSPITAL_COMMUNITY): Payer: Self-pay | Admitting: Surgery

## 2017-09-07 ENCOUNTER — Other Ambulatory Visit: Payer: Self-pay

## 2017-09-07 MED ORDER — METFORMIN HCL 1000 MG PO TABS: 1000.0000 mg | ORAL_TABLET | Freq: Two times a day (BID) | ORAL | 0 refills | Status: DC

## 2017-09-07 NOTE — Telephone Encounter (Signed)
Sent refill

## 2017-09-08 ENCOUNTER — Telehealth: Payer: Self-pay | Admitting: Family Medicine

## 2017-09-08 NOTE — Telephone Encounter (Signed)
Copied from [place]. Topic: Quick Communication - Rx Refill/Question >> Sep 08, 2017  4:51 PM Oliver Pila B wrote: Medication: metFORMIN (GLUCOPHAGE) 1000 MG tablet [[id number]]   Pharmacy has not received medication   Has the patient contacted their pharmacy? Yes.     (Agent: If no, request that the patient contact the pharmacy for the refill.)   Preferred Pharmacy (with phone number or street name): [facility]   Agent: Please be advised that RX refills may take up to 3 business days. We ask that you follow-up with your pharmacy.

## 2017-09-09 ENCOUNTER — Encounter: Payer: Self-pay | Admitting: *Deleted

## 2017-09-09 ENCOUNTER — Other Ambulatory Visit: Payer: Self-pay | Admitting: *Deleted

## 2017-09-09 MED ORDER — METFORMIN HCL 1000 MG PO TABS: 1000.0000 mg | ORAL_TABLET | Freq: Two times a day (BID) | ORAL | 0 refills | Status: DC

## 2017-09-09 NOTE — Progress Notes (Signed)
This encounter was created in error - please disregard.

## 2017-09-16 ENCOUNTER — Encounter: Payer: Self-pay | Admitting: Family Medicine

## 2017-09-16 ENCOUNTER — Ambulatory Visit (INDEPENDENT_AMBULATORY_CARE_PROVIDER_SITE_OTHER): Payer: 59 | Admitting: Family Medicine

## 2017-09-16 VITALS — BP 120/82 | HR 56 | Temp 97.8°F | Ht 68.0 in | Wt 276.5 lb

## 2017-09-16 DIAGNOSIS — Z114 Encounter for screening for human immunodeficiency virus [HIV]: Secondary | ICD-10-CM

## 2017-09-16 DIAGNOSIS — Z6841 Body Mass Index (BMI) 40.0 and over, adult: Secondary | ICD-10-CM | POA: Diagnosis not present

## 2017-09-16 DIAGNOSIS — E785 Hyperlipidemia, unspecified: Secondary | ICD-10-CM

## 2017-09-16 DIAGNOSIS — Z1159 Encounter for screening for other viral diseases: Secondary | ICD-10-CM

## 2017-09-16 DIAGNOSIS — I25110 Atherosclerotic heart disease of native coronary artery with unstable angina pectoris: Secondary | ICD-10-CM

## 2017-09-16 DIAGNOSIS — Z0001 Encounter for general adult medical examination with abnormal findings: Secondary | ICD-10-CM

## 2017-09-16 DIAGNOSIS — I5022 Chronic systolic (congestive) heart failure: Secondary | ICD-10-CM

## 2017-09-16 DIAGNOSIS — Z125 Encounter for screening for malignant neoplasm of prostate: Secondary | ICD-10-CM | POA: Diagnosis not present

## 2017-09-16 DIAGNOSIS — R5383 Other fatigue: Secondary | ICD-10-CM

## 2017-09-16 DIAGNOSIS — E1165 Type 2 diabetes mellitus with hyperglycemia: Secondary | ICD-10-CM

## 2017-09-16 DIAGNOSIS — I10 Essential (primary) hypertension: Secondary | ICD-10-CM | POA: Diagnosis not present

## 2017-09-16 DIAGNOSIS — R5382 Chronic fatigue, unspecified: Secondary | ICD-10-CM | POA: Insufficient documentation

## 2017-09-16 DIAGNOSIS — E118 Type 2 diabetes mellitus with unspecified complications: Secondary | ICD-10-CM

## 2017-09-16 DIAGNOSIS — IMO0002 Reserved for concepts with insufficient information to code with codable children: Secondary | ICD-10-CM

## 2017-09-16 MED ORDER — AMLODIPINE BESYLATE 10 MG PO TABS: 10.0000 mg | ORAL_TABLET | Freq: Every day | ORAL | 3 refills | Status: DC

## 2017-09-16 MED ORDER — NITROGLYCERIN 0.4 MG SL SUBL: 0.4000 mg | SUBLINGUAL_TABLET | SUBLINGUAL | 1 refills | Status: AC | PRN

## 2017-09-16 MED ORDER — ROSUVASTATIN CALCIUM 40 MG PO TABS: 40.0000 mg | ORAL_TABLET | Freq: Every day | ORAL | 3 refills | Status: DC

## 2017-09-16 MED ORDER — METFORMIN HCL 1000 MG PO TABS: 1000.0000 mg | ORAL_TABLET | Freq: Two times a day (BID) | ORAL | 3 refills | Status: DC

## 2017-09-16 MED ORDER — LOSARTAN POTASSIUM-HCTZ 100-25 MG PO TABS: 1.0000 | ORAL_TABLET | Freq: Every day | ORAL | 3 refills | Status: DC

## 2017-09-16 MED ORDER — CARVEDILOL 6.25 MG PO TABS: 6.2500 mg | ORAL_TABLET | Freq: Two times a day (BID) | ORAL | 3 refills | Status: DC

## 2017-09-16 NOTE — Patient Instructions (Addendum)
Labs today.  You are doing well today.  Return in 6 months for follow up visit, may need sooner pending labs.   Health Maintenance, Male A healthy lifestyle and preventive care is important for your health and wellness. Ask your health care provider about what schedule of regular examinations is right for you. What should I know about weight and diet? Eat a Healthy Diet  Eat plenty of vegetables, fruits, whole grains, low-fat dairy products, and lean protein.  Do not eat a lot of foods high in solid fats, added sugars, or salt.  Maintain a Healthy Weight Regular exercise can help you achieve or maintain a healthy weight. You should:  Do at least 150 minutes of exercise each week. The exercise should increase your heart rate and make you sweat (moderate-intensity exercise).  Do strength-training exercises at least twice a week.  Watch Your Levels of Cholesterol and Blood Lipids  Have your blood tested for lipids and cholesterol every 5 years starting at 59 years of age. If you are at high risk for heart disease, you should start having your blood tested when you are 59 years old. You may need to have your cholesterol levels checked more often if: ? Your lipid or cholesterol levels are high. ? You are older than 59 years of age. ? You are at high risk for heart disease.  What should I know about cancer screening? Many types of cancers can be detected early and may often be prevented. Lung Cancer  You should be screened every year for lung cancer if: ? You are a current smoker who has smoked for at least 30 years. ? You are a former smoker who has quit within the past 15 years.  Talk to your health care provider about your screening options, when you should start screening, and how often you should be screened.  Colorectal Cancer  Routine colorectal cancer screening usually begins at 59 years of age and should be repeated every 5-10 years until you are 59 years old. You may need  to be screened more often if early forms of precancerous polyps or small growths are found. Your health care provider may recommend screening at an earlier age if you have risk factors for colon cancer.  Your health care provider may recommend using home test kits to check for hidden blood in the stool.  A small camera at the end of a tube can be used to examine your colon (sigmoidoscopy or colonoscopy). This checks for the earliest forms of colorectal cancer.  Prostate and Testicular Cancer  Depending on your age and overall health, your health care provider may do certain tests to screen for prostate and testicular cancer.  Talk to your health care provider about any symptoms or concerns you have about testicular or prostate cancer.  Skin Cancer  Check your skin from head to toe regularly.  Tell your health care provider about any new moles or changes in moles, especially if: ? There is a change in a mole's size, shape, or color. ? You have a mole that is larger than a pencil eraser.  Always use sunscreen. Apply sunscreen liberally and repeat throughout the day.  Protect yourself by wearing long sleeves, pants, a wide-brimmed hat, and sunglasses when outside.  What should I know about heart disease, diabetes, and high blood pressure?  If you are 22-29 years of age, have your blood pressure checked every 3-5 years. If you are 33 years of age or older, have your  blood pressure checked every year. You should have your blood pressure measured twice-once when you are at a hospital or clinic, and once when you are not at a hospital or clinic. Record the average of the two measurements. To check your blood pressure when you are not at a hospital or clinic, you can use: ? An automated blood pressure machine at a pharmacy. ? A home blood pressure monitor.  Talk to your health care provider about your target blood pressure.  If you are between 46-3 years old, ask your health care provider if  you should take aspirin to prevent heart disease.  Have regular diabetes screenings by checking your fasting blood sugar level. ? If you are at a normal weight and have a low risk for diabetes, have this test once every three years after the age of 68. ? If you are overweight and have a high risk for diabetes, consider being tested at a younger age or more often.  A one-time screening for abdominal aortic aneurysm (AAA) by ultrasound is recommended for men aged 93-75 years who are current or former smokers. What should I know about preventing infection? Hepatitis B If you have a higher risk for hepatitis B, you should be screened for this virus. Talk with your health care provider to find out if you are at risk for hepatitis B infection. Hepatitis C Blood testing is recommended for:  Everyone born from [birth year] through 1967.  Anyone with known risk factors for hepatitis C.  Sexually Transmitted Diseases (STDs)  You should be screened each year for STDs including gonorrhea and chlamydia if: ? You are sexually active and are younger than 59 years of age. ? You are older than 59 years of age and your health care provider tells you that you are at risk for this type of infection. ? Your sexual activity has changed since you were last screened and you are at an increased risk for chlamydia or gonorrhea. Ask your health care provider if you are at risk.  Talk with your health care provider about whether you are at high risk of being infected with HIV. Your health care provider may recommend a prescription medicine to help prevent HIV infection.  What else can I do?  Schedule regular health, dental, and eye exams.  Stay current with your vaccines (immunizations).  Do not use any tobacco products, such as cigarettes, chewing tobacco, and e-cigarettes. If you need help quitting, ask your health care provider.  Limit alcohol intake to no more than 2 drinks per day. One drink equals 12 ounces of  beer, 5 ounces of wine, or 1 ounces of hard liquor.  Do not use street drugs.  Do not share needles.  Ask your health care provider for help if you need support or information about quitting drugs.  Tell your health care provider if you often feel depressed.  Tell your health care provider if you have ever been abused or do not feel safe at home. This information is not intended to replace advice given to you by your health care provider. Make sure you discuss any questions you have with your health care provider. Document Released: 12/27/2007 Document Revised: 02/27/2016 Document Reviewed: 04/03/2015 Elsevier Interactive Patient Education  [employer].

## 2017-09-16 NOTE — Assessment & Plan Note (Signed)
Preventative protocols reviewed and updated unless pt declined. Discussed healthy diet and lifestyle.  

## 2017-09-16 NOTE — Assessment & Plan Note (Signed)
Keep upcoming f/u with cards. S/p 3 stents 2017

## 2017-09-16 NOTE — Assessment & Plan Note (Addendum)
Chronic, patient states improved control with regular garlic intake. Will check A1c today.  Discussed victoza - insurance requests other meds tried first. Pt agrees to byetta if needed.

## 2017-09-16 NOTE — Assessment & Plan Note (Signed)
Encouraged healthy diet and lifestyle choices for sustainable weight loss ?

## 2017-09-16 NOTE — Assessment & Plan Note (Signed)
Continue f/u with cards

## 2017-09-16 NOTE — Assessment & Plan Note (Signed)
Chronic, stable. Continue hyzaar and carvedilol and spironolactone.

## 2017-09-16 NOTE — Assessment & Plan Note (Signed)
Chronic, compliant with statin and fish oil.  The ASCVD Risk score Mikey Bussing DC Jr., et al., 2013) failed to calculate for the following reasons:   The patient has a prior MI or stroke diagnosis

## 2017-09-16 NOTE — Assessment & Plan Note (Signed)
Discussed testosterone - pt may return for am check.

## 2017-09-16 NOTE — Progress Notes (Signed)
BP 120/82 (BP Location: Left Arm, Patient Position: Sitting, Cuff Size: Large)   Pulse (!) 56   Temp 97.8 F (36.6 C) (Oral)   Ht 5\' 8"  (1.727 m)   Wt 276 lb 8 oz (125.4 kg)   SpO2 97%   BMI 42.04 kg/m    CC: CPE Subjective:    Patient ID: Adam Patterson, male    DOB: [birth year], 57 y.o.   MRN: [id number]  HPI: Adam Patterson is a 57 y.o. male presenting on 09/16/2017 for Annual Exam (Requests 90 day rxs for all meds. Will be traveling out of country in 12/2017. )   Upcoming trip to [place] in June. Requests 3 mo supply of all medications.  Catheterization done last fall (04/2017) Adrian Prows). Has upcoming f/u with cards.   DM - Ran out of metformin for 2 wks, had trouble getting med refilled through our office. He started eating 6 cloves of garlic a day - reports significant improved glycemic control.   Ongoing low energy - father had low testosterone. Sex drive down, denies depression. Would like T tested.   Preventative: COLONOSCOPY 02/2009 per patient normal [facility])- no records available Prostate cancer screening - discussed, would like screening Flu shot yearly Pneumovax 2018 Tetanus - unsure Seat belt use discussed Sunscreen use discussed. No changing moles on skin.  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 Interim medical history since our last visit reviewed. Allergies and medications reviewed and updated. Outpatient Medications Prior to Visit  Medication Sig Dispense Refill  . aspirin 81 MG tablet Take 81 mg by mouth daily.      . Cholecalciferol (VITAMIN D3) 2000 UNITS TABS Take  2,000 Units by mouth 2 (two) times daily.     . fish oil-omega-3 fatty acids 1000 MG capsule Take 1 g by mouth 2 (two) times daily.     . Magnesium 250 MG TABS Take 250 mg by mouth 2 (two) times daily.    Marland Kitchen spironolactone (ALDACTONE) 25 MG tablet Take 1 tablet (25 mg total) by mouth every morning. 30 tablet 3  . ticagrelor (BRILINTA) 90 MG TABS tablet Take 1 tablet (90 mg total) by mouth 2 (two) times daily. 60 tablet 11  . vitamin B-12 (CYANOCOBALAMIN) 1000 MCG tablet Take 1,000 mcg by mouth 2 (two) times daily.    Marland Kitchen amLODipine (NORVASC) 10 MG tablet Take 10 mg by mouth daily.    . carvedilol (COREG) 6.25 MG tablet Take 6.25 mg by mouth 2 (two) times daily with a meal.    . losartan-hydrochlorothiazide (HYZAAR) 100-25 MG tablet Take 1 tablet by mouth daily.    . metFORMIN (GLUCOPHAGE) 1000 MG tablet Take 1 tablet (1,000 mg total) by mouth 2 (two) times daily. Needs annual physical appointment for further refills 60 tablet 0  . nitroGLYCERIN (NITROSTAT) 0.4 MG SL tablet Place 1 tablet (0.4 mg total) under the tongue every 5 (five) minutes x 3 doses as needed for chest pain. 25 tablet 1  . rosuvastatin (CRESTOR) 40 MG tablet Take 40 mg by mouth daily.   3  . liraglutide 18 MG/3ML SOPN Take 0.6 mg  once daily for 1 week then increase to 1.2 mg daily. (Patient not taking: Reported on 09/16/2017) 2 pen 3   No facility-administered medications prior to visit.      Per HPI unless specifically indicated in ROS section below Review of Systems  Constitutional: Negative for activity change, appetite change, chills, fatigue, fever and unexpected weight change.  HENT: Positive for nosebleeds. Negative for hearing loss.   Eyes: Negative for visual disturbance.  Respiratory: Negative for cough, chest tightness, shortness of breath and wheezing.   Cardiovascular: Negative for chest pain, palpitations and leg swelling.  Gastrointestinal: Negative for abdominal distention, abdominal pain, blood in stool,  constipation, diarrhea, nausea and vomiting.  Endocrine: Positive for cold intolerance.  Genitourinary: Negative for difficulty urinating and hematuria.  Musculoskeletal: Negative for arthralgias, myalgias and neck pain.  Skin: Negative for rash.  Neurological: Negative for dizziness, seizures, syncope and headaches.  Hematological: Negative for adenopathy. Bruises/bleeds easily.  Psychiatric/Behavioral: Negative for dysphoric mood. The patient is not nervous/anxious.        Objective:    BP 120/82 (BP Location: Left Arm, Patient Position: Sitting, Cuff Size: Large)   Pulse (!) 56   Temp 97.8 F (36.6 C) (Oral)   Ht 5\' 8"  (1.727 m)   Wt 276 lb 8 oz (125.4 kg)   SpO2 97%   BMI 42.04 kg/m   Wt Readings from Last 3 Encounters:  09/16/17 276 lb 8 oz (125.4 kg)  05/12/17 279 lb (126.6 kg)  04/15/17 280 lb 4 oz (127.1 kg)    Physical Exam  Constitutional: He is oriented to person, place, and time. He appears well-developed and well-nourished. No distress.  HENT:  Head: Normocephalic and atraumatic.  Right Ear: Hearing, tympanic membrane, external ear and ear canal normal.  Left Ear: Hearing, tympanic membrane, external ear and ear canal normal.  Nose: Nose normal.  Mouth/Throat: Uvula is midline, oropharynx is clear and moist and mucous membranes are normal. No oropharyngeal exudate, posterior oropharyngeal edema or posterior oropharyngeal erythema.  Eyes: Conjunctivae and EOM are normal. Pupils are equal, round, and reactive to light. No scleral icterus.  Neck: Normal range of motion. Neck supple. No thyromegaly present.  Cardiovascular: Normal rate, regular rhythm, normal heart sounds and intact distal pulses.  No murmur heard. Pulses:      Radial pulses are 2+ on the right side, and 2+ on the left side.  Pulmonary/Chest: Effort normal and breath sounds normal. No respiratory distress. He has no wheezes. He has no rales.  Abdominal: Soft. Bowel sounds are normal. He exhibits no  distension and no mass. There is no tenderness. There is no rebound and no guarding.  Genitourinary: Rectum normal and prostate normal. Rectal exam shows no external hemorrhoid, no internal hemorrhoid, no fissure, no mass, no tenderness and anal tone normal. Prostate is not enlarged (15gm) and not tender.  Musculoskeletal: Normal range of motion. He exhibits no edema.  Lymphadenopathy:    He has no cervical adenopathy.  Neurological: He is alert and oriented to person, place, and time.  CN grossly intact, station and gait intact  Skin: Skin is warm and dry. No rash noted.  Psychiatric: He has a normal mood and affect. His behavior is normal. Judgment and thought content normal.  Nursing note and vitals reviewed.     Assessment & Plan:   Problem List Items Addressed This Visit    Body mass index (BMI) of 40.0-44.9 in adult [facility])    Encouraged healthy diet and lifestyle choices for  sustainable weight loss.       Relevant Medications   metFORMIN (GLUCOPHAGE) 1000 MG tablet   CAD (coronary artery disease), native coronary artery    Keep upcoming f/u with cards. S/p 3 stents 2017      Relevant Medications   nitroGLYCERIN (NITROSTAT) 0.4 MG SL tablet   amLODipine (NORVASC) 10 MG tablet   carvedilol (COREG) 6.25 MG tablet   losartan-hydrochlorothiazide (HYZAAR) 100-25 MG tablet   rosuvastatin (CRESTOR) 40 MG tablet   Diabetes mellitus type 2, uncontrolled, with complications (HCC)    Chronic, patient states improved control with regular garlic intake. Will check A1c today.  Discussed victoza - insurance requests other meds tried first. Pt agrees to byetta if needed.       Relevant Medications   losartan-hydrochlorothiazide (HYZAAR) 100-25 MG tablet   metFORMIN (GLUCOPHAGE) 1000 MG tablet   rosuvastatin (CRESTOR) 40 MG tablet   Other Relevant Orders   Hemoglobin A1c   Encounter for general adult medical examination with abnormal findings - Primary    Preventative protocols  reviewed and updated unless pt declined. Discussed healthy diet and lifestyle.       HFrEF (heart failure with reduced ejection fraction) ([place])    Continue f/u with cards.      Relevant Medications   nitroGLYCERIN (NITROSTAT) 0.4 MG SL tablet   amLODipine (NORVASC) 10 MG tablet   carvedilol (COREG) 6.25 MG tablet   losartan-hydrochlorothiazide (HYZAAR) 100-25 MG tablet   rosuvastatin (CRESTOR) 40 MG tablet   Hyperlipidemia    Chronic, compliant with statin and fish oil.  The ASCVD Risk score Mikey Bussing DC Jr., et al., 2013) failed to calculate for the following reasons:   The patient has a prior MI or stroke diagnosis       Relevant Medications   nitroGLYCERIN (NITROSTAT) 0.4 MG SL tablet   amLODipine (NORVASC) 10 MG tablet   carvedilol (COREG) 6.25 MG tablet   losartan-hydrochlorothiazide (HYZAAR) 100-25 MG tablet   rosuvastatin (CRESTOR) 40 MG tablet   Other Relevant Orders   Lipid panel   Comprehensive metabolic panel   TSH   Hypertension    Chronic, stable. Continue hyzaar and carvedilol and spironolactone.      Relevant Medications   nitroGLYCERIN (NITROSTAT) 0.4 MG SL tablet   amLODipine (NORVASC) 10 MG tablet   carvedilol (COREG) 6.25 MG tablet   losartan-hydrochlorothiazide (HYZAAR) 100-25 MG tablet   rosuvastatin (CRESTOR) 40 MG tablet   Low energy    Discussed testosterone - pt may return for am check.      Relevant Orders   Testosterone    Other Visit Diagnoses    Special screening for malignant neoplasm of prostate       Relevant Orders   PSA   Need for hepatitis C screening test       Relevant Orders   Hepatitis C antibody   Encounter for screening for HIV       Relevant Orders   HIV antibody       Meds ordered this encounter  Medications  . nitroGLYCERIN (NITROSTAT) 0.4 MG SL tablet    Sig: Place 1 tablet (0.4 mg total) under the tongue every 5 (five) minutes x 3 doses as needed for chest pain.    Dispense:  25 tablet    Refill:  1  .  amLODipine (NORVASC) 10 MG tablet    Sig: Take 1 tablet (10 mg total) by mouth daily.    Dispense:  90 tablet    Refill:  3  . carvedilol (COREG) 6.25 MG tablet    Sig: Take 1 tablet (6.25 mg total) by mouth 2 (two) times daily with a meal.    Dispense:  180 tablet    Refill:  3  . losartan-hydrochlorothiazide (HYZAAR) 100-25 MG tablet    Sig: Take 1 tablet by mouth daily.    Dispense:  90 tablet    Refill:  3  . metFORMIN (GLUCOPHAGE) 1000 MG tablet    Sig: Take 1 tablet (1,000 mg total) by mouth 2 (two) times daily. Needs annual physical appointment for further refills    Dispense:  180 tablet    Refill:  3  . rosuvastatin (CRESTOR) 40 MG tablet    Sig: Take 1 tablet (40 mg total) by mouth daily.    Dispense:  90 tablet    Refill:  3   Orders Placed This Encounter  Procedures  . Lipid panel  . Comprehensive metabolic panel  . TSH  . PSA  . HIV antibody  . Hepatitis C antibody  . Hemoglobin A1c  . Testosterone    Standing Status:   Future    Standing Expiration Date:   09/17/2018    Follow up plan: Return in about 6 months (around 03/19/2018) for follow up visit.  Ria Bush, MD

## 2017-09-17 LAB — LIPID PANEL
CHOLESTEROL: 110 mg/dL (ref 0–200)
HDL: 56.9 mg/dL (ref >39.00)
LDL CALC: 41 mg/dL (ref 0–99)
NonHDL: 53.06
Total CHOL/HDL Ratio: 2
Triglycerides: 60 mg/dL (ref 0.0–149.0)
VLDL: 12 mg/dL (ref 0.0–40.0)

## 2017-09-17 LAB — COMPREHENSIVE METABOLIC PANEL
ALBUMIN: 4.5 g/dL (ref 3.5–5.2)
ALT: 18 U/L (ref 0–53)
AST: 25 U/L (ref 0–37)
Alkaline Phosphatase: 39 U/L (ref 39–117)
BILIRUBIN TOTAL: 0.7 mg/dL (ref 0.2–1.2)
BUN: 17 mg/dL (ref 6–23)
CHLORIDE: 98 meq/L (ref 96–112)
CO2: 30 meq/L (ref 19–32)
CREATININE: 1.02 mg/dL (ref 0.40–1.50)
Calcium: 10.2 mg/dL (ref 8.4–10.5)
GFR: 79.66 mL/min (ref >60.00)
Glucose, Bld: 88 mg/dL (ref 70–99)
Potassium: 3.5 mEq/L (ref 3.5–5.1)
SODIUM: 137 meq/L (ref 135–145)
Total Protein: 7 g/dL (ref 6.0–8.3)

## 2017-09-17 LAB — HIV ANTIBODY (ROUTINE TESTING W REFLEX): HIV 1&2 Ab, 4th Generation: NONREACTIVE

## 2017-09-17 LAB — PSA: PSA: 2.18 ng/mL (ref 0.10–4.00)

## 2017-09-17 LAB — HEMOGLOBIN A1C: HEMOGLOBIN A1C: 7.7 % — AB (ref 4.6–6.5)

## 2017-09-17 LAB — HEPATITIS C ANTIBODY
Hepatitis C Ab: NONREACTIVE
SIGNAL TO CUT-OFF: 0.03 (ref <1.00)

## 2017-09-17 LAB — TSH: TSH: 2.1 u[IU]/mL (ref 0.35–4.50)

## 2017-09-24 DIAGNOSIS — I10 Essential (primary) hypertension: Secondary | ICD-10-CM | POA: Diagnosis not present

## 2017-09-24 DIAGNOSIS — E782 Mixed hyperlipidemia: Secondary | ICD-10-CM | POA: Diagnosis not present

## 2017-09-24 DIAGNOSIS — I25119 Atherosclerotic heart disease of native coronary artery with unspecified angina pectoris: Secondary | ICD-10-CM | POA: Diagnosis not present

## 2017-09-24 DIAGNOSIS — Q245 Malformation of coronary vessels: Secondary | ICD-10-CM | POA: Diagnosis not present

## 2017-09-25 ENCOUNTER — Other Ambulatory Visit: Payer: Self-pay | Admitting: Family Medicine

## 2017-09-25 NOTE — Progress Notes (Signed)
brillinta d/c'd by cardiology Spironolactone changed to spironolactone/HCTZ

## 2017-11-25 ENCOUNTER — Encounter: Payer: Self-pay | Admitting: Family Medicine

## 2017-11-30 ENCOUNTER — Ambulatory Visit (INDEPENDENT_AMBULATORY_CARE_PROVIDER_SITE_OTHER): Payer: 59 | Admitting: Family Medicine

## 2017-11-30 ENCOUNTER — Encounter: Payer: Self-pay | Admitting: Family Medicine

## 2017-11-30 ENCOUNTER — Other Ambulatory Visit: Payer: Self-pay

## 2017-11-30 VITALS — BP 102/60 | HR 46 | Temp 98.2°F | Ht 68.0 in | Wt 278.5 lb

## 2017-11-30 DIAGNOSIS — K529 Noninfective gastroenteritis and colitis, unspecified: Secondary | ICD-10-CM

## 2017-11-30 MED ORDER — DULAGLUTIDE 0.75 MG/0.5ML ~~LOC~~ SOAJ: 0.7500 mg | SUBCUTANEOUS | 3 refills | Status: DC

## 2017-11-30 NOTE — Patient Instructions (Signed)
Water - keep hydrated  If all studies normal, then add in some scheduled fiber.  (Metamucil, Citrucel, Benefiber)

## 2017-11-30 NOTE — Progress Notes (Signed)
Dr. Frederico Hamman T. Rosalie Gelpi, MD, [facility] [place], [address] Phone: [phone] Fax: [phone]  11/30/2017  Patient: Adam Patterson, MRN: [id number], DOB: 08-31-1960, 57 y.o.  Primary Physician:  Ria Bush, MD   Chief Complaint  Patient presents with  . Diarrhea  . Abdominal Pain   Subjective:   Adam Patterson is a 57 y.o. very pleasant male patient who presents with the following:  Three weeks ago, started to get some loose bowel movements. 7-8 times daily for 3 weeks.  Has had a previous colonoscopy.  There is no blood or mucus in the stool.  He has not had any kind of fever.  This is been [place] or persistent problem, and it is bothering him at work.  He is not  had any persistent, excessive flatulence.    He denies any prior history of ulcerative colitis, Crohn's disease, And has never had any celiac sprue.  Had an MI in 2017.  No recent antibiotics.   Past Medical History, Surgical History, Social History, Family History, Problem List, Medications, and Allergies have been reviewed and updated if relevant.  Patient Active Problem List   Diagnosis Date Noted  . Low energy 09/16/2017  . Encounter for general adult medical examination with abnormal findings 09/16/2017  . Carotid stenosis 06/07/2017  . Primary localized osteoarthrosis of right shoulder 06/07/2017  . Angina pectoris ([place]) 05/10/2017  . Right shoulder pain 04/15/2017  . HFrEF (heart failure with reduced ejection fraction) ([place]) 03/28/2017  . Psoriasis 03/09/2017  . OSA (obstructive sleep apnea)   . CAD (coronary artery disease), native coronary artery 02/04/2016  . 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 Diabetes mellitus type 2, uncontrolled, with complications ([employer]) 92/42/6834  . Hypertension 03/13/2011  . Hyperlipidemia 03/13/2011  . Body mass index (BMI) of 40.0-44.9 in adult [facility]) 03/13/2011    Past Medical History:    Diagnosis Date  . Abnormal liver enzymes 2006   no prior workup  . Acute MI, inferior wall, initial episode of care ([place]) 02/04/2016   cardiac arrest s/p 3 DES placed to dominant RCA ([place])  . Diabetes mellitus   . Hyperlipidemia   . Hypertension   . Myocardial infarction ([place])   . Obesity   . OSA (obstructive sleep apnea)    doesn't use machine. sleep study 2007  . Psoriasis    affecting hands (skin)    Past Surgical History:  Procedure Laterality Date  . CARDIAC CATHETERIZATION N/A 02/04/2016   Procedure: Left Heart Cath and Coronary Angiography;  Surgeon: Adrian Prows, MD;  Location: [facility];  Service: Cardiovascular;  Laterality: N/A;  . CARDIAC CATHETERIZATION N/A 02/04/2016   3 stents placed ([place])  . CARDIOVASCULAR STRESS TEST  03/2017   high risk - EF 34%, inferior and apical infarct and akinesis ([place])  . COLONOSCOPY  02/2009  . HERNIA REPAIR  2000  . LEFT HEART CATH AND CORONARY ANGIOGRAPHY N/A 05/12/2017   Procedure: LEFT HEART CATH AND CORONARY ANGIOGRAPHY;  Surgeon: Adrian Prows, MD  . [place]  . VASECTOMY  1995    Social History   Socioeconomic History  . Marital status: Married    Spouse name: Not on file  . Number of children: Not on file  . Years of education: Not on file  . Highest education level: Not on file  Occupational History  . Not on file  Social Needs  . Financial resource strain:  Not on file  . Food insecurity:    Worry: Not on file    Inability: Not on file  . Transportation needs:    Medical: Not on file    Non-medical: Not on file  Tobacco Use  . Smoking status: Light Tobacco Smoker    Types: Pipe, Cigarettes    Last attempt to quit: 03/30/2010    Years since quitting: 7.6  . Smokeless tobacco: Never Used  Substance and Sexual Activity  . Alcohol use: Yes    Comment: on weekends  . Drug use: No  . Sexual activity: Never  Lifestyle  . Physical activity:    Days per week: Not on file    Minutes per  session: Not on file  . Stress: Not on file  Relationships  . Social connections:    Talks on phone: Not on file    Gets together: Not on file    Attends religious service: Not on file    Active member of club or organization: Not on file    Attends meetings of clubs or organizations: Not on file    Relationship status: Not on file  . Intimate partner violence:    Fear of current or ex partner: Not on file    Emotionally abused: Not on file    Physically abused: Not on file    Forced sexual activity: Not on file  Other Topics Concern  . 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 Diabetes Mother   . Breast cancer Mother 50  . Diabetes Sister   . Muscular dystrophy Sister   . CAD Father   . Diabetes Brother   . Diabetes Sister     Allergies  Allergen Reactions  . Pravachol [Pravastatin Sodium] Other (See Comments)    Medication list reviewed and updated in full in [facility].  ROS: GEN: Acute illness details above GI: Tolerating PO intake GU: maintaining adequate hydration and urination Pulm: No SOB Interactive and getting along well at home.  Otherwise, ROS is as per the HPI.  Objective:   BP 102/60   Pulse (!) 46   Temp 98.2 F (36.8 C) (Oral)   Ht 5\' 8"  (1.727 m)   Wt 278 lb 8 oz (126.3 kg)   BMI 42.35 kg/m   GEN: WDWN, NAD, Non-toxic, A & O x 3 HEENT: Atraumatic, Normocephalic. Neck supple. No masses, No LAD. Ears and Nose: No external deformity. CV: RRR, No M/G/R. No JVD. No thrill. No extra heart sounds. PULM: CTA B, no wheezes, crackles, rhonchi. No retractions. No resp. distress. No accessory muscle use. ABD: S, NT, ND, +BS. No rebound. No HSM. EXTR: No  c/c/e NEURO Normal gait.  PSYCH: Normally interactive. Conversant. Not depressed or anxious appearing.  Calm demeanor.     Laboratory and Imaging Data:  Assessment and Plan:   Chronic diarrhea - Plan: C. difficile GDH and Toxin A/B, Stool culture, Stool culture, C. difficile GDH and Toxin A/B, CANCELED: Stool culture, CANCELED: C. difficile GDH and Toxin A/B  Diarrhea of unclear origin with 3 weeks duration.  Check for C. difficile, as well as stool culture.  Now, I am going to have him add some Imodium as well as some extra fiber.  He is nontoxic in appearance currently.  Reassuring exam.   Follow-up: No follow-ups on file.  Orders Placed This Encounter  Procedures  . Stool culture  . C. difficile GDH and Toxin A/B    Signed,  Doratha Mcswain T. Everett Ehrler, MD   Allergies as of 11/30/2017      Reactions   Pravachol [pravastatin Sodium] Other (See Comments)      Medication List        Accurate as of 11/30/17 11:59 PM. Always use your most recent med list.          amLODipine 10 MG tablet Commonly known as:  NORVASC Take 1 tablet (10 mg total) by mouth daily.   aspirin 81 MG tablet Take 81 mg by mouth daily.   carvedilol 6.25 MG tablet Commonly known as:  COREG Take 1 tablet (6.25 mg total) by mouth 2 (two) times daily with a meal.   Dulaglutide 0.75 MG/0.5ML Sopn Commonly known as:  TRULICITY Inject 6.30 mg into the skin once a week.   fish oil-omega-3 fatty acids 1000 MG capsule Take 1 g by mouth 2 (two) times daily.   losartan-hydrochlorothiazide 100-25 MG tablet Commonly known as:  HYZAAR Take 1 tablet by mouth daily.   Magnesium 250 MG Tabs Take 250 mg by mouth 2 (two) times daily.   metFORMIN 1000 MG tablet Commonly known as:  GLUCOPHAGE Take 1 tablet (1,000 mg total) by mouth 2 (two) times daily. Needs annual physical appointment for further refills   nitroGLYCERIN 0.4 MG SL tablet Commonly known as:  NITROSTAT Place 1 tablet (0.4 mg total) under the  tongue every 5 (five) minutes x 3 doses as needed for chest pain.   rosuvastatin 40 MG tablet Commonly known as:  CRESTOR Take 1 tablet (40 mg total) by mouth daily.   spironolactone 25 MG tablet Commonly known as:  ALDACTONE Take 1 tablet (25 mg total) by mouth every morning.   vitamin B-12 1000 MCG tablet Commonly known as:  CYANOCOBALAMIN Take 1,000 mcg by mouth 2 (two) times daily.   Vitamin D3 2000 units Tabs Take 2,000 Units by mouth 2 (two) times daily.

## 2017-11-30 NOTE — Telephone Encounter (Signed)
victoza was not covered (insurance required byetta trial first). Will send in trulicity to price out with insurance.

## 2017-12-01 ENCOUNTER — Encounter: Payer: Self-pay | Admitting: Family Medicine

## 2017-12-04 ENCOUNTER — Telehealth: Payer: Self-pay

## 2017-12-04 ENCOUNTER — Encounter: Payer: Self-pay | Admitting: Family Medicine

## 2017-12-04 DIAGNOSIS — R197 Diarrhea, unspecified: Secondary | ICD-10-CM

## 2017-12-04 DIAGNOSIS — R1084 Generalized abdominal pain: Secondary | ICD-10-CM

## 2017-12-04 LAB — STOOL CULTURE
MICRO NUMBER: 90612385
MICRO NUMBER: 90612387
MICRO NUMBER: 90612388
SHIGA RESULT:: NOT DETECTED
SPECIMEN QUALITY: ADEQUATE
SPECIMEN QUALITY: ADEQUATE
SPECIMEN QUALITY:: ADEQUATE

## 2017-12-04 LAB — C. DIFFICILE GDH AND TOXIN A/B
GDH ANTIGEN: NOT DETECTED
MICRO NUMBER:: 90612386
SPECIMEN QUALITY: ADEQUATE
TOXIN A AND B: NOT DETECTED

## 2017-12-04 NOTE — Telephone Encounter (Signed)
LMOM. All stool studes normal resulted today.  My original work note released him to full duty 12/02/2017.   If he is still having significant GI symptoms, after several weeks, we can always get GI to see him next week.  If better / improving, then I would not worry at all.

## 2017-12-04 NOTE — Telephone Encounter (Signed)
Copied from [place] [id number]. Topic: Quick Communication - Lab Results >> Dec 04, 2017 11:31 AM Marja Kays F wrote: Pt is looking for lab results from his last visit on 11/30/17 and he states he needs these results as soon as possible because we can not go back to work until he gets the results   Best number [phone]

## 2017-12-09 NOTE — Telephone Encounter (Signed)
Sounds like ongoing diarrhea - and plan from Dr Lorelei Pont was to set up with GI? Referral placed. plz notify patient - if ongoing symptoms would try and get set up with GI.

## 2017-12-14 ENCOUNTER — Ambulatory Visit: Payer: 59 | Admitting: Gastroenterology

## 2017-12-14 ENCOUNTER — Other Ambulatory Visit: Payer: Self-pay

## 2017-12-14 ENCOUNTER — Encounter: Payer: Self-pay | Admitting: Gastroenterology

## 2017-12-14 VITALS — BP 114/68 | HR 50 | Ht 68.0 in | Wt 277.0 lb

## 2017-12-14 DIAGNOSIS — R197 Diarrhea, unspecified: Secondary | ICD-10-CM | POA: Diagnosis not present

## 2017-12-14 MED ORDER — NA SULFATE-K SULFATE-MG SULF 17.5-3.13-1.6 GM/177ML PO SOLN: 1.0000 | ORAL | 0 refills | Status: DC

## 2017-12-14 NOTE — Progress Notes (Signed)
Gastroenterology Consultation  Referring Provider:     Ria Bush, MD Primary Care Physician:  Ria Bush, MD Primary Gastroenterologist:  Dr. Allen Norris     Reason for Consultation:     Diarrhea        HPI:   Adam Patterson is a 57 y.o. y/o male referred for consultation & management of diarrhea by Dr. Ria Bush, MD.  This patient went to see her primary care provider because of diarrhea that started the beginning of May.  The patient was having 7-8 bowel movements per day.  The patient had previously had a colonoscopy in 2010 by Dr. Elnora Morrison.  Had denied any rectal bleeding or mucus in his stools.  The patient's stool studies consisted of Salmonella, Shigella and C. Difficile. The patient is concerned because later this month he will be going to [place] to visit his son who is a para trooper in the armed services. The patient reports the diarrhea is both during the day and during the night.  He does not have any rectal bleeding or any unexplained weight loss.  Past Medical History:  Diagnosis Date  . Abnormal liver enzymes 2006   no prior workup  . Acute MI, inferior wall, initial episode of care ([place]) 02/04/2016   cardiac arrest s/p 3 DES placed to dominant RCA ([place])  . Diabetes mellitus   . Hyperlipidemia   . Hypertension   . Myocardial infarction ([place])   . Obesity   . OSA (obstructive sleep apnea)    doesn't use machine. sleep study 2007  . Psoriasis    affecting hands (skin)    Past Surgical History:  Procedure Laterality Date  . CARDIAC CATHETERIZATION N/A 02/04/2016   Procedure: Left Heart Cath and Coronary Angiography;  Surgeon: Adrian Prows, MD;  Location: [facility];  Service: Cardiovascular;  Laterality: N/A;  . CARDIAC CATHETERIZATION N/A 02/04/2016   3 stents placed ([place])  . CARDIOVASCULAR STRESS TEST  03/2017   high risk - EF 34%, inferior and apical infarct and akinesis ([place])  . COLONOSCOPY  02/2009  . HERNIA REPAIR  2000  .  LEFT HEART CATH AND CORONARY ANGIOGRAPHY N/A 05/12/2017   Procedure: LEFT HEART CATH AND CORONARY ANGIOGRAPHY;  Surgeon: Adrian Prows, MD  . [place]  . VASECTOMY  1995    Prior to Admission medications   Medication Sig Start Date End Date Taking? Authorizing Provider  amLODipine (NORVASC) 10 MG tablet Take 1 tablet (10 mg total) by mouth daily. 09/16/17   Ria Bush, MD  aspirin 81 MG tablet Take 81 mg by mouth daily.      [provider]  carvedilol (COREG) 6.25 MG tablet Take 1 tablet (6.25 mg total) by mouth 2 (two) times daily with a meal. 09/16/17   Ria Bush, MD  Cholecalciferol (VITAMIN D3) 2000 UNITS TABS Take 2,000 Units by mouth 2 (two) times daily.     [provider]  Dulaglutide (TRULICITY) 0.16 WF/0.9NA SOPN Inject 0.75 mg into the skin once a week. Patient not taking: Reported on 11/30/2017 11/30/17   Ria Bush, MD  fish oil-omega-3 fatty acids 1000 MG capsule Take 1 g by mouth 2 (two) times daily.     [provider]  losartan-hydrochlorothiazide (HYZAAR) 100-25 MG tablet Take 1 tablet by mouth daily. 09/16/17   Ria Bush, MD  Magnesium 250 MG TABS Take 250 mg by mouth 2 (two) times daily.    [provider]  metFORMIN (GLUCOPHAGE) 1000 MG  tablet Take 1 tablet (1,000 mg total) by mouth 2 (two) times daily. Needs annual physical appointment for further refills 09/16/17   Ria Bush, MD  nitroGLYCERIN (NITROSTAT) 0.4 MG SL tablet Place 1 tablet (0.4 mg total) under the tongue every 5 (five) minutes x 3 doses as needed for chest pain. 09/16/17   Ria Bush, MD  rosuvastatin (CRESTOR) 40 MG tablet Take 1 tablet (40 mg total) by mouth daily. 09/16/17   Ria Bush, MD  spironolactone (ALDACTONE) 25 MG tablet Take 1 tablet (25 mg total) by mouth every morning. 05/12/17 05/12/18  Adrian Prows, MD  vitamin B-12 (CYANOCOBALAMIN) 1000 MCG tablet Take 1,000 mcg by mouth 2 (two) times daily.    [provider]    Family History  Problem Relation Age of Onset  . Diabetes Mother   . Breast cancer Mother 67  . Diabetes Sister   . Muscular dystrophy Sister   . CAD Father   . Diabetes Brother   . Diabetes Sister      Social History   Tobacco Use  . Smoking status: Light Tobacco Smoker    Types: Pipe, Cigarettes    Last attempt to quit: 03/30/2010    Years since quitting: 7.7  . Smokeless tobacco: Never Used  Substance Use Topics  . Alcohol use: Yes    Comment: on weekends  . Drug use: No    Allergies as of 12/14/2017 - Review Complete 12/14/2017  Allergen Reaction Noted  . Pravachol [pravastatin sodium] Other (See Comments) 05/25/2017    Review of Systems:    All systems reviewed and negative except where noted in HPI.   Physical Exam:  BP 114/68   Pulse (!) 50   Ht 5\' 8"  (1.727 m)   Wt 277 lb (125.6 kg)   BMI 42.12 kg/m  No LMP for male patient. Psych:  Alert and cooperative. Normal mood and affect. General:   Alert,  Well-developed, well-nourished, pleasant and cooperative in NAD Head:  Normocephalic and atraumatic. Eyes:  Sclera clear, no icterus.   Conjunctiva pink. Ears:  Normal auditory acuity. Nose:  No deformity, discharge, or lesions. Mouth:  No deformity or lesions,oropharynx pink & moist. Neck:  Supple; no masses or thyromegaly. Lungs:  Respirations even and unlabored.  Clear throughout to auscultation.   No wheezes, crackles, or rhonchi. No acute distress. Heart:  Regular rate and rhythm; no murmurs, clicks, rubs, or gallops. Abdomen:  Normal bowel sounds.  No bruits.  Soft, non-tender and non-distended without masses, hepatosplenomegaly or hernias noted.  No guarding or rebound tenderness.  Negative Carnett sign.   Rectal:  Deferred.  Msk:  Symmetrical without gross deformities.  Good, equal movement & strength bilaterally. Pulses:  Normal pulses noted. Extremities:  No clubbing or edema.  No cyanosis. Neurologic:  Alert and oriented x3;   grossly normal neurologically. Skin:  Intact without significant lesions or rashes.  No jaundice. Lymph Nodes:  No significant cervical adenopathy. Psych:  Alert and cooperative. Normal mood and affect.  Imaging Studies: No results found.  Assessment and Plan:   Adam Patterson is a 57 y.o. y/o male who comes in with diarrhea for the last month. The patient will have a GI panel to look for other pathogens besides the Salmonella, Shigella and C. Difficile that was already sent off. The patient will also be set up for a colonoscopy to rule out microscopic colitis. I have discussed risks & benefits which include, but are not limited to, bleeding, infection, perforation &  drug reaction.  The patient agrees with this plan & written consent will be obtained.     Lucilla Lame, MD. Marval Regal   Note: This dictation was prepared with Dragon dictation along with smaller phrase technology. Any transcriptional errors that result from this process are unintentional.

## 2017-12-15 ENCOUNTER — Other Ambulatory Visit: Payer: Self-pay

## 2017-12-15 DIAGNOSIS — R197 Diarrhea, unspecified: Secondary | ICD-10-CM

## 2017-12-16 ENCOUNTER — Encounter: Payer: Self-pay | Admitting: Family Medicine

## 2017-12-16 DIAGNOSIS — R197 Diarrhea, unspecified: Secondary | ICD-10-CM | POA: Diagnosis not present

## 2017-12-16 MED ORDER — FLUOXETINE HCL 20 MG PO CAPS: 20.0000 mg | ORAL_CAPSULE | Freq: Every day | ORAL | 1 refills | Status: DC

## 2017-12-17 ENCOUNTER — Telehealth: Payer: Self-pay | Admitting: Gastroenterology

## 2017-12-17 NOTE — Telephone Encounter (Signed)
Sharee Pimple with [facility] left vm for Olivia Mackie to let her know no Prior Authorization is needed for procedure cb [phone]

## 2017-12-18 ENCOUNTER — Ambulatory Visit: Payer: 59 | Admitting: Anesthesiology

## 2017-12-18 ENCOUNTER — Ambulatory Visit
Admission: RE | Admit: 2017-12-18 | Discharge: 2017-12-18 | Disposition: A | Discharge status: Home / Self Care | Payer: 59 | Source: Ambulatory Visit | Attending: Gastroenterology | Admitting: Gastroenterology

## 2017-12-18 ENCOUNTER — Encounter
Admission: RE | Disposition: A | Discharge status: Home / Self Care | Payer: Self-pay | Source: Ambulatory Visit | Attending: Gastroenterology

## 2017-12-18 DIAGNOSIS — E1151 Type 2 diabetes mellitus with diabetic peripheral angiopathy without gangrene: Secondary | ICD-10-CM | POA: Diagnosis not present

## 2017-12-18 DIAGNOSIS — K5730 Diverticulosis of large intestine without perforation or abscess without bleeding: Secondary | ICD-10-CM | POA: Insufficient documentation

## 2017-12-18 DIAGNOSIS — D125 Benign neoplasm of sigmoid colon: Secondary | ICD-10-CM | POA: Diagnosis not present

## 2017-12-18 DIAGNOSIS — Z7982 Long term (current) use of aspirin: Secondary | ICD-10-CM | POA: Diagnosis not present

## 2017-12-18 DIAGNOSIS — I10 Essential (primary) hypertension: Secondary | ICD-10-CM | POA: Insufficient documentation

## 2017-12-18 DIAGNOSIS — I252 Old myocardial infarction: Secondary | ICD-10-CM | POA: Diagnosis not present

## 2017-12-18 DIAGNOSIS — K635 Polyp of colon: Secondary | ICD-10-CM | POA: Diagnosis not present

## 2017-12-18 DIAGNOSIS — F17290 Nicotine dependence, other tobacco product, uncomplicated: Secondary | ICD-10-CM | POA: Insufficient documentation

## 2017-12-18 DIAGNOSIS — Z79899 Other long term (current) drug therapy: Secondary | ICD-10-CM | POA: Insufficient documentation

## 2017-12-18 DIAGNOSIS — F17210 Nicotine dependence, cigarettes, uncomplicated: Secondary | ICD-10-CM | POA: Diagnosis not present

## 2017-12-18 DIAGNOSIS — D123 Benign neoplasm of transverse colon: Secondary | ICD-10-CM | POA: Diagnosis not present

## 2017-12-18 DIAGNOSIS — Z8249 Family history of ischemic heart disease and other diseases of the circulatory system: Secondary | ICD-10-CM | POA: Insufficient documentation

## 2017-12-18 DIAGNOSIS — E785 Hyperlipidemia, unspecified: Secondary | ICD-10-CM | POA: Diagnosis not present

## 2017-12-18 DIAGNOSIS — K529 Noninfective gastroenteritis and colitis, unspecified: Secondary | ICD-10-CM | POA: Insufficient documentation

## 2017-12-18 DIAGNOSIS — G4733 Obstructive sleep apnea (adult) (pediatric): Secondary | ICD-10-CM | POA: Insufficient documentation

## 2017-12-18 DIAGNOSIS — R197 Diarrhea, unspecified: Secondary | ICD-10-CM

## 2017-12-18 DIAGNOSIS — Z7984 Long term (current) use of oral hypoglycemic drugs: Secondary | ICD-10-CM | POA: Diagnosis not present

## 2017-12-18 HISTORY — PX: POLYPECTOMY: SHX5525

## 2017-12-18 HISTORY — PX: COLONOSCOPY WITH PROPOFOL: SHX5780

## 2017-12-18 LAB — GI PROFILE, STOOL, PCR
ASTROVIRUS: NOT DETECTED
Adenovirus F 40/41: NOT DETECTED
C DIFFICILE TOXIN A/B: NOT DETECTED
CAMPYLOBACTER: NOT DETECTED
Cryptosporidium: NOT DETECTED
Cyclospora cayetanensis: NOT DETECTED
ENTAMOEBA HISTOLYTICA: NOT DETECTED
Enteroaggregative E coli: NOT DETECTED
Enteropathogenic E coli: NOT DETECTED
Enterotoxigenic E coli: NOT DETECTED
GIARDIA LAMBLIA: NOT DETECTED
Norovirus GI/GII: DETECTED — AB
PLESIOMONAS SHIGELLOIDES: NOT DETECTED
Rotavirus A: NOT DETECTED
SHIGELLA/ENTEROINVASIVE E COLI: NOT DETECTED
Salmonella: NOT DETECTED
Sapovirus: NOT DETECTED
Shiga-toxin-producing E coli: NOT DETECTED
VIBRIO CHOLERAE: NOT DETECTED
Vibrio: NOT DETECTED
YERSINIA ENTEROCOLITICA: NOT DETECTED

## 2017-12-18 LAB — GLUCOSE, CAPILLARY
GLUCOSE-CAPILLARY: 84 mg/dL (ref 65–99)
Glucose-Capillary: 88 mg/dL (ref 65–99)

## 2017-12-18 SURGERY — COLONOSCOPY WITH PROPOFOL
Anesthesia: General

## 2017-12-18 MED ORDER — PROPOFOL 10 MG/ML IV BOLUS
INTRAVENOUS | Status: DC | PRN
  Administered 2017-12-18: 50 mg via INTRAVENOUS
  Administered 2017-12-18: 30 mg via INTRAVENOUS
  Administered 2017-12-18: 20 mg via INTRAVENOUS
  Administered 2017-12-18: 50 mg via INTRAVENOUS
  Administered 2017-12-18: 100 mg via INTRAVENOUS

## 2017-12-18 MED ORDER — MEPERIDINE HCL 25 MG/ML IJ SOLN: 6.2500 mg | INTRAMUSCULAR | Status: DC | PRN

## 2017-12-18 MED ORDER — LIDOCAINE HCL (CARDIAC) PF 100 MG/5ML IV SOSY
PREFILLED_SYRINGE | INTRAVENOUS | Status: DC | PRN
  Administered 2017-12-18: 40 mg via INTRAVENOUS

## 2017-12-18 MED ORDER — OXYCODONE HCL 5 MG PO TABS: 5.0000 mg | ORAL_TABLET | Freq: Once | ORAL | Status: DC | PRN

## 2017-12-18 MED ORDER — FENTANYL CITRATE (PF) 100 MCG/2ML IJ SOLN: 25.0000 ug | INTRAMUSCULAR | Status: DC | PRN

## 2017-12-18 MED ORDER — PROMETHAZINE HCL 25 MG/ML IJ SOLN: 6.2500 mg | INTRAMUSCULAR | Status: DC | PRN

## 2017-12-18 MED ORDER — LACTATED RINGERS IV SOLN
INTRAVENOUS | Status: DC
  Administered 2017-12-18: 09:00:00 via INTRAVENOUS

## 2017-12-18 MED ORDER — OXYCODONE HCL 5 MG/5ML PO SOLN: 5.0000 mg | Freq: Once | ORAL | Status: DC | PRN

## 2017-12-18 MED ORDER — SODIUM CHLORIDE 0.9 % IV SOLN: INTRAVENOUS | Status: DC

## 2017-12-18 MED ORDER — STERILE WATER FOR IRRIGATION IR SOLN
Status: DC | PRN
  Administered 2017-12-18: 09:00:00

## 2017-12-18 SURGICAL SUPPLY — 24 items
CANISTER SUCT 1200ML W/VALVE (MISCELLANEOUS) ×4 IMPLANT
CLIP HMST 235XBRD CATH ROT (MISCELLANEOUS) IMPLANT
CLIP RESOLUTION 360 11X235 (MISCELLANEOUS)
ELECT REM PT RETURN 9FT ADLT (ELECTROSURGICAL)
ELECTRODE REM PT RTRN 9FT ADLT (ELECTROSURGICAL) IMPLANT
FCP ESCP3.2XJMB 240X2.8X (MISCELLANEOUS)
FORCEPS BIOP RAD 4 LRG CAP 4 (CUTTING FORCEPS) ×4 IMPLANT
FORCEPS BIOP RJ4 240 W/NDL (MISCELLANEOUS)
FORCEPS ESCP3.2XJMB 240X2.8X (MISCELLANEOUS) IMPLANT
GOWN CVR UNV OPN BCK APRN NK (MISCELLANEOUS) ×4 IMPLANT
GOWN ISOL THUMB LOOP REG UNIV (MISCELLANEOUS) ×4
INJECTOR VARIJECT VIN23 (MISCELLANEOUS) IMPLANT
KIT DEFENDO VALVE AND CONN (KITS) IMPLANT
KIT ENDO PROCEDURE OLY (KITS) ×4 IMPLANT
MARKER SPOT ENDO TATTOO 5ML (MISCELLANEOUS) IMPLANT
PROBE APC STR FIRE (PROBE) IMPLANT
RETRIEVER NET ROTH 2.5X230 LF (MISCELLANEOUS) IMPLANT
SNARE SHORT THROW 13M SML OVAL (MISCELLANEOUS) IMPLANT
SNARE SHORT THROW 30M LRG OVAL (MISCELLANEOUS) IMPLANT
SNARE SNG USE RND 15MM (INSTRUMENTS) IMPLANT
SPOT EX ENDOSCOPIC TATTOO (MISCELLANEOUS)
TRAP ETRAP POLY (MISCELLANEOUS) IMPLANT
VARIJECT INJECTOR VIN23 (MISCELLANEOUS)
WATER STERILE IRR 250ML POUR (IV SOLUTION) ×4 IMPLANT

## 2017-12-18 NOTE — Anesthesia Procedure Notes (Signed)
Procedure Name: MAC Date/Time: 12/18/2017 8:57 AM Performed by: Janna Arch, CRNA Pre-anesthesia Checklist: Patient identified, Emergency Drugs available, Suction available and Patient being monitored Patient Re-evaluated:Patient Re-evaluated prior to induction Oxygen Delivery Method: Nasal cannula

## 2017-12-18 NOTE — Anesthesia Preprocedure Evaluation (Addendum)
Anesthesia Evaluation  Patient identified by MRN, date of birth, ID band Patient awake    Reviewed: Allergy & Precautions, H&P , NPO status , Patient's Chart, lab work & pertinent test results, reviewed documented beta blocker date and time   Airway Mallampati: II  TM Distance: >3 FB Neck ROM: full    Dental no notable dental hx.    Pulmonary sleep apnea , Current Smoker,    Pulmonary exam normal breath sounds clear to auscultation       Cardiovascular Exercise Tolerance: Good hypertension, + angina + CAD, + Past MI and + Peripheral Vascular Disease  negative cardio ROS   Rhythm:regular Rate:Normal     Neuro/Psych negative neurological ROS  negative psych ROS   GI/Hepatic Neg liver ROS, diarrhea   Endo/Other  diabetesBMI 41  Renal/GU negative Renal ROS  negative genitourinary   Musculoskeletal   Abdominal   Peds  Hematology negative hematology ROS (+)   Anesthesia Other Findings   Reproductive/Obstetrics negative OB ROS                            Anesthesia Physical Anesthesia Plan  ASA: III  Anesthesia Plan: General   Post-op Pain Management:    Induction:   PONV Risk Score and Plan:   Airway Management Planned:   Additional Equipment:   Intra-op Plan:   Post-operative Plan:   Informed Consent: I have reviewed the patients History and Physical, chart, labs and discussed the procedure including the risks, benefits and alternatives for the proposed anesthesia with the patient or authorized representative who has indicated his/her understanding and acceptance.   Dental Advisory Given  Plan Discussed with: CRNA  Anesthesia Plan Comments:        Anesthesia Quick Evaluation

## 2017-12-18 NOTE — Op Note (Signed)
[facility] Gastroenterology Patient Name: Adam Patterson Procedure Date: 12/18/2017 8:55 AM MRN: [id number] Account #: [id number] Date of Birth: 30-Aug-1960 Admit Type: Outpatient Age: 57 Room: [facility] OR ROOM 01 Gender: Male Note Status: Finalized Procedure:            Colonoscopy Indications:          Chronic diarrhea Providers:            Lucilla Lame MD, MD Referring MD:         Ria Bush (Referring MD) Medicines:            Propofol per Anesthesia Complications:        No immediate complications. Procedure:            Pre-Anesthesia Assessment:                       - Prior to the procedure, a History and Physical was                        performed, and patient medications and allergies were                        reviewed. The patient's tolerance of previous                        anesthesia was also reviewed. The risks and benefits of                        the procedure and the sedation options and risks were                        discussed with the patient. All questions were                        answered, and informed consent was obtained. Prior                        Anticoagulants: The patient has taken no previous                        anticoagulant or antiplatelet agents. ASA Grade                        Assessment: II - A patient with mild systemic disease.                        After reviewing the risks and benefits, the patient was                        deemed in satisfactory condition to undergo the                        procedure.                       After obtaining informed consent, the colonoscope was                        passed under direct vision. Throughout the procedure,  the patient's blood pressure, pulse, and oxygen                        saturations were monitored continuously. The                        Colonoscope was introduced through the anus and                        advanced to the the  terminal ileum. The colonoscopy was                        performed without difficulty. The patient tolerated the                        procedure well. The quality of the bowel preparation                        was excellent. Findings:      The perianal and digital rectal examinations were normal.      A 3 mm polyp was found in the transverse colon. The polyp was sessile.       The polyp was removed with a cold biopsy forceps. Resection and       retrieval were complete.      A 6 mm polyp was found in the sigmoid colon. The polyp was sessile. The       polyp was removed with a cold snare. Resection and retrieval were       complete.      Multiple small-mouthed diverticula were found in the sigmoid colon and       descending colon.      The terminal ileum appeared normal. Biopsies were taken with a cold       forceps for histology.      Biopsies were taken with a cold forceps in the entire colon for       histology. Random biopsies were obtained with cold forceps for histology       randomly. Impression:           - One 3 mm polyp in the transverse colon, removed with                        a cold biopsy forceps. Resected and retrieved.                       - One 6 mm polyp in the sigmoid colon, removed with a                        cold snare. Resected and retrieved.                       - Diverticulosis in the sigmoid colon and in the                        descending colon.                       - The examined portion of the ileum was normal.                        Biopsied.                       -  Biopsies were taken with a cold forceps for histology                        in the entire colon.                       - Random biopsies were obtained. Recommendation:       - Discharge patient to home.                       - Resume previous diet.                       - Continue present medications.                       - Await pathology results.                       - Repeat  colonoscopy in 5 years if polyp adenoma and 10                        years if hyperplastic Procedure Code(s):    --- Professional ---                       [phone], Colonoscopy, flexible; with removal of tumor(s),                        polyp(s), or other lesion(s) by snare technique                       [id number], 35, Colonoscopy, flexible; with biopsy, single                        or multiple Diagnosis Code(s):    --- Professional ---                       K52.9, Noninfective gastroenteritis and colitis,                        unspecified                       D12.5, Benign neoplasm of sigmoid colon                       D12.3, Benign neoplasm of transverse colon (hepatic                        flexure or splenic flexure) CPT copyright 2017 American Medical Association. All rights reserved. The codes documented in this report are preliminary and upon coder review may  be revised to meet current compliance requirements. Lucilla Lame MD, MD 12/18/2017 9:20:37 AM This report has been signed electronically. Number of Addenda: 0 Note Initiated On: 12/18/2017 8:55 AM Scope Withdrawal Time: 0 hours 12 minutes 10 seconds  Total Procedure Duration: 0 hours 14 minutes 54 seconds       [facility]

## 2017-12-18 NOTE — H&P (Signed)
Lucilla Lame, MD [facility] [address], [place], [place] [address] Phone:[phone] Fax : [phone]  Primary Care Physician:  Ria Bush, MD Primary Gastroenterologist:  Dr. Allen Norris  Pre-Procedure History & Physical: HPI:  Adam Patterson is a 56 y.o. male is here for an colonoscopy.   Past Medical History:  Diagnosis Date  . Abnormal liver enzymes 2006   no prior workup  . Acute MI, inferior wall, initial episode of care ([employer]) 02/04/2016   cardiac arrest s/p 3 DES placed to dominant RCA ([place])  . Diabetes mellitus   . Hyperlipidemia   . Hypertension   . Myocardial infarction ([place])   . Obesity   . OSA (obstructive sleep apnea)    doesn't use machine. sleep study 2007  . Psoriasis    affecting hands (skin)    Past Surgical History:  Procedure Laterality Date  . CARDIAC CATHETERIZATION N/A 02/04/2016   Procedure: Left Heart Cath and Coronary Angiography;  Surgeon: Adrian Prows, MD;  Location: [facility];  Service: Cardiovascular;  Laterality: N/A;  . CARDIAC CATHETERIZATION N/A 02/04/2016   3 stents placed ([place])  . CARDIOVASCULAR STRESS TEST  03/2017   high risk - EF 34%, inferior and apical infarct and akinesis ([place])  . COLONOSCOPY  02/2009  . HERNIA REPAIR  2000  . LEFT HEART CATH AND CORONARY ANGIOGRAPHY N/A 05/12/2017   Procedure: LEFT HEART CATH AND CORONARY ANGIOGRAPHY;  Surgeon: Adrian Prows, MD  . [place]  . VASECTOMY  1995    Prior to Admission medications   Medication Sig Start Date End Date Taking? Authorizing Provider  amLODipine (NORVASC) 10 MG tablet Take 1 tablet (10 mg total) by mouth daily. 09/16/17  Yes Ria Bush, MD  aspirin 81 MG tablet Take 81 mg by mouth daily.     Yes [provider]  carvedilol (COREG) 6.25 MG tablet Take 1 tablet (6.25 mg total) by mouth 2 (two) times daily with a meal. 09/16/17  Yes Ria Bush, MD  Cholecalciferol (VITAMIN D3) 2000 UNITS TABS Take 2,000 Units by mouth 2  (two) times daily.    Yes [provider]  Dulaglutide (TRULICITY) 2.69 SW/5.4OE SOPN Inject 0.75 mg into the skin once a week. 11/30/17  Yes Ria Bush, MD  fish oil-omega-3 fatty acids 1000 MG capsule Take 1 g by mouth 2 (two) times daily.    Yes [provider]  FLUoxetine (PROZAC) 20 MG capsule Take 1 capsule (20 mg total) by mouth daily. 12/16/17  Yes Ria Bush, MD  losartan-hydrochlorothiazide (HYZAAR) 100-25 MG tablet Take 1 tablet by mouth daily. 09/16/17  Yes Ria Bush, MD  Magnesium 250 MG TABS Take 250 mg by mouth 2 (two) times daily.   Yes [provider]  metFORMIN (GLUCOPHAGE) 1000 MG tablet Take 1 tablet (1,000 mg total) by mouth 2 (two) times daily. Needs annual physical appointment for further refills 09/16/17  Yes Ria Bush, MD  Na Sulfate-K Sulfate-Mg Sulf (SUPREP BOWEL PREP KIT) 17.5-3.13-1.6 GM/177ML SOLN Take 1 kit by mouth as directed. 12/14/17  Yes Lucilla Lame, MD  nitroGLYCERIN (NITROSTAT) 0.4 MG SL tablet Place 1 tablet (0.4 mg total) under the tongue every 5 (five) minutes x 3 doses as needed for chest pain. 09/16/17  Yes Ria Bush, MD  rosuvastatin (CRESTOR) 40 MG tablet Take 1 tablet (40 mg total) by mouth daily. 09/16/17  Yes Ria Bush, MD  spironolactone (ALDACTONE) 25 MG tablet Take 1 tablet (25 mg total) by mouth every morning.  05/12/17 05/12/18 Yes Adrian Prows, MD  vitamin B-12 (CYANOCOBALAMIN) 1000 MCG tablet Take 1,000 mcg by mouth 2 (two) times daily.   Yes [provider]  aspirin 325 MG tablet Take 325 mg by mouth daily.    [provider]  DULoxetine (CYMBALTA) 60 MG capsule  12/01/17   [provider]    Allergies as of 12/15/2017 - Review Complete 12/15/2017  Allergen Reaction Noted  . Pravachol [pravastatin sodium] Other (See Comments) 05/25/2017    Family History  Problem Relation Age of Onset  . Diabetes Mother   . Breast cancer Mother 62  . Diabetes Sister   .  Muscular dystrophy Sister   . CAD Father   . Diabetes Brother   . Diabetes Sister     Social History   Socioeconomic History  . Marital status: Married    Spouse name: Not on file  . Number of children: Not on file  . Years of education: Not on file  . Highest education level: Not on file  Occupational History  . Not on file  Social Needs  . Financial resource strain: Not on file  . Food insecurity:    Worry: Not on file    Inability: Not on file  . Transportation needs:    Medical: Not on file    Non-medical: Not on file  Tobacco Use  . Smoking status: Light Tobacco Smoker    Types: Pipe, Cigarettes    Last attempt to quit: 03/30/2010    Years since quitting: 7.7  . Smokeless tobacco: Never Used  Substance and Sexual Activity  . Alcohol use: Yes    Alcohol/week: 3.0 oz    Types: 5 Cans of beer per week  . Drug use: No  . Sexual activity: Never  Lifestyle  . Physical activity:    Days per week: Not on file    Minutes per session: Not on file  . Stress: Not on file  Relationships  . Social connections:    Talks on phone: Not on file    Gets together: Not on file    Attends religious service: Not on file    Active member of club or organization: Not on file    Attends meetings of clubs or organizations: Not on file    Relationship status: Not on file  . Intimate partner violence:    Fear of current or ex partner: Not on file    Emotionally abused: Not on file    Physically abused: Not on file    Forced sexual activity: Not on file  Other Topics Concern  . 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 Neck:  Supple; no masses or thyromegaly. Lungs:  Clear throughout to auscultation.    Heart:  Regular rate and rhythm. Abdomen:  Soft, nontender and nondistended. Normal bowel sounds, without guarding, and without  rebound.   Neurologic:  Alert and  oriented x4;  grossly normal neurologically.  Impression/Plan: [place]ajo Royal is here for an colonoscopy to be performed for diarrhea  Risks, benefits, limitations, and alternatives regarding  colonoscopy have been reviewed with the patient.  Questions have been answered.  All parties agreeable.   Lucilla Lame, MD  12/18/2017, 8:00 AM

## 2017-12-18 NOTE — Transfer of Care (Signed)
Immediate Anesthesia Transfer of Care Note  Patient: Adam Patterson  Procedure(s) Performed: COLONOSCOPY WITH PROPOFOL (N/A ) POLYPECTOMY  Patient Location: PACU  Anesthesia Type: General  Level of Consciousness: awake, alert  and patient cooperative  Airway and Oxygen Therapy: Patient Spontanous Breathing and Patient connected to supplemental oxygen  Post-op Assessment: Post-op Vital signs reviewed, Patient's Cardiovascular Status Stable, Respiratory Function Stable, Patent Airway and No signs of Nausea or vomiting  Post-op Vital Signs: Reviewed and stable  Complications: No apparent anesthesia complications

## 2017-12-18 NOTE — Anesthesia Postprocedure Evaluation (Signed)
Anesthesia Post Note  Patient: Adam Patterson  Procedure(s) Performed: COLONOSCOPY WITH PROPOFOL (N/A ) POLYPECTOMY  Patient location during evaluation: PACU Anesthesia Type: General Level of consciousness: awake and alert Pain management: pain level controlled Vital Signs Assessment: post-procedure vital signs reviewed and stable Respiratory status: spontaneous breathing, nonlabored ventilation, respiratory function stable and patient connected to nasal cannula oxygen Cardiovascular status: blood pressure returned to baseline and stable Postop Assessment: no apparent nausea or vomiting Anesthetic complications: no    SCOURAS, NICOLE ELAINE

## 2017-12-20 ENCOUNTER — Encounter: Payer: Self-pay | Admitting: Family Medicine

## 2017-12-21 ENCOUNTER — Encounter: Payer: Self-pay | Admitting: Gastroenterology

## 2017-12-22 ENCOUNTER — Encounter: Payer: Self-pay | Admitting: Gastroenterology

## 2017-12-24 ENCOUNTER — Encounter: Payer: Self-pay | Admitting: Family Medicine

## 2018-01-06 IMAGING — CT CT HEAD W/O CM
3 series · 15 of 47 positions shown, 18 images · non-contrast
Comparison: None.

CLINICAL DATA: 54-year-old male with acute slurred speech and
dizziness. Code stroke.

EXAM:
CT HEAD WITHOUT CONTRAST
TECHNIQUE: Contiguous axial images were obtained from the base of the skull
through the vertex without intravenous contrast.

[Series 2: head wo · axial · 0.45mm/px · z∈[-119,+6]mm · 9 of 30 slices shown, 12 images]
[im 3/30  brain]
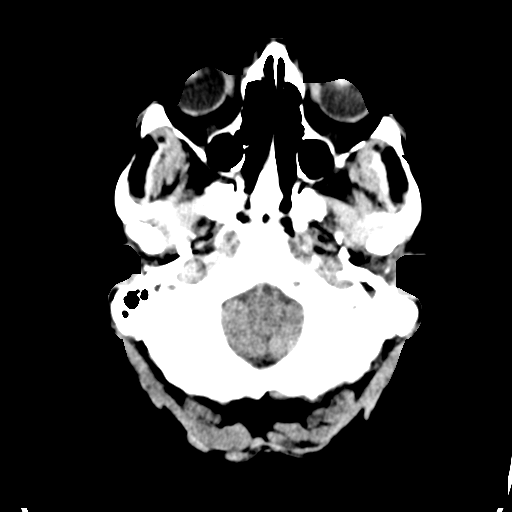
[im 3/30  bone]
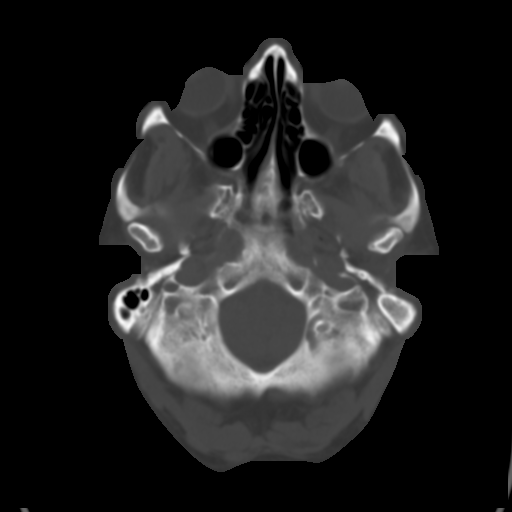
[im 6/30  brain]
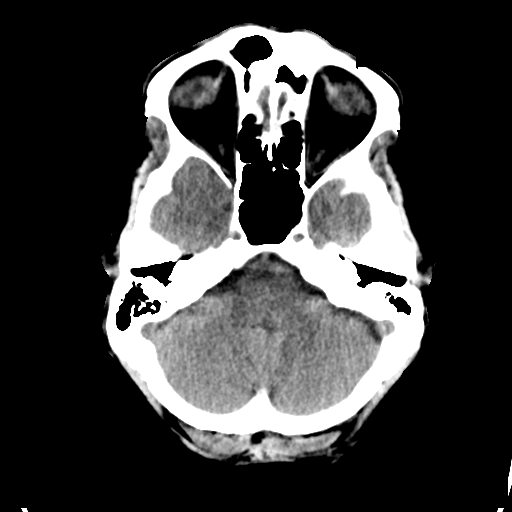
[im 9/30  brain]
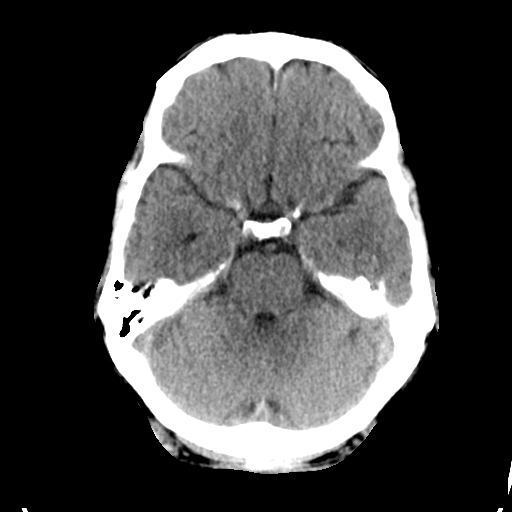
[im 12/30  brain]
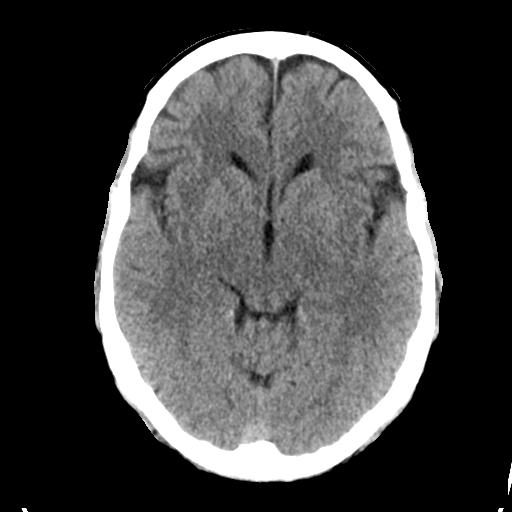
[im 16/30  brain]
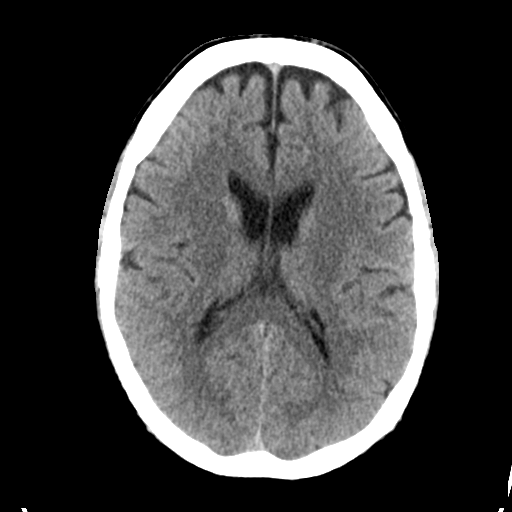
[im 16/30  bone]
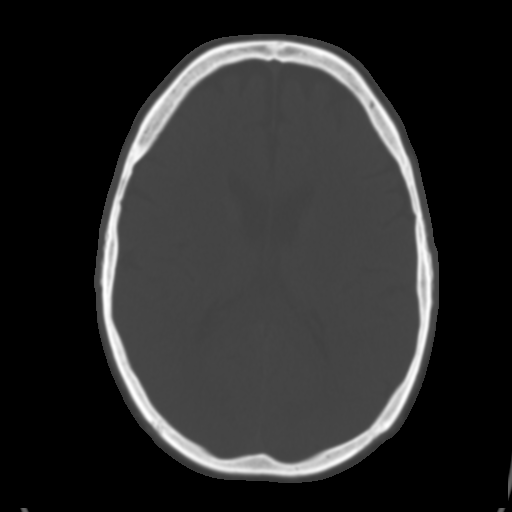
[im 19/30  brain]
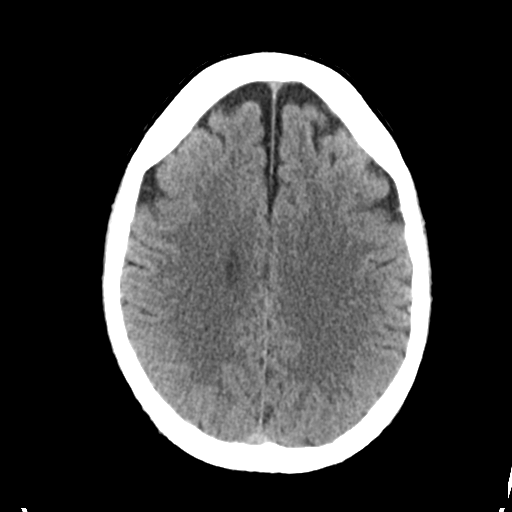
[im 22/30  brain]
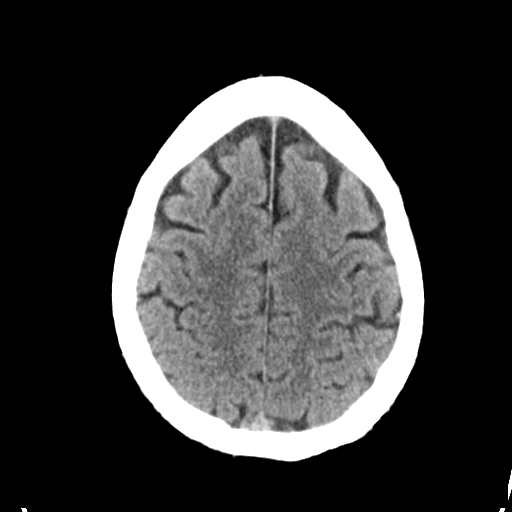
[im 25/30  brain]
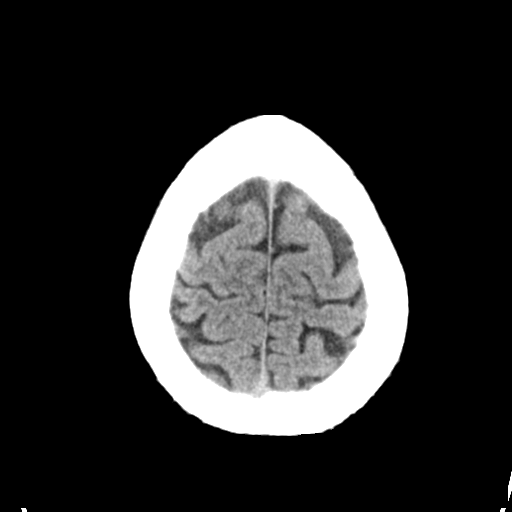
[im 28/30  brain]
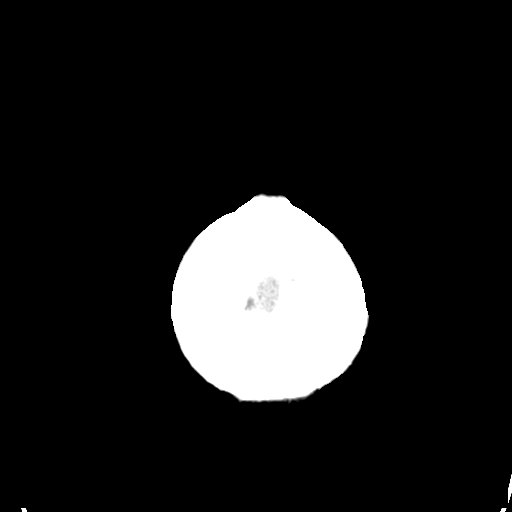
[im 28/30  bone]
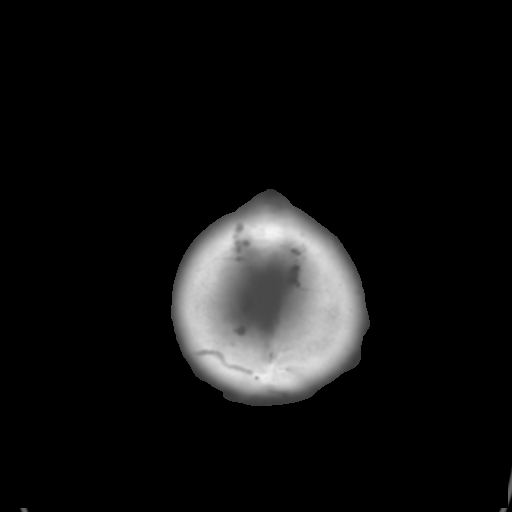

[Series 4: coronal soft tissue · coronal · 0.32mm/px · 3 of 71 slices shown]
[im 24/71  brain]
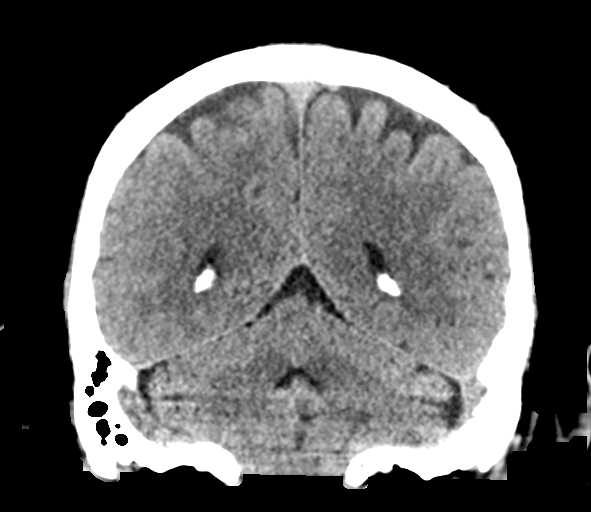
[im 32/71  brain]
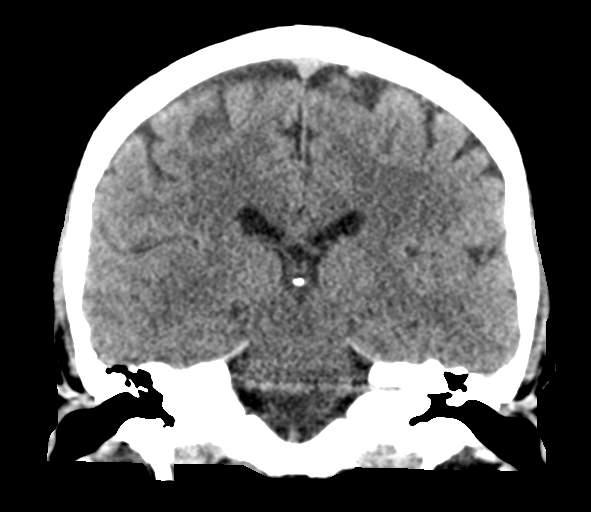
[im 39/71  brain]
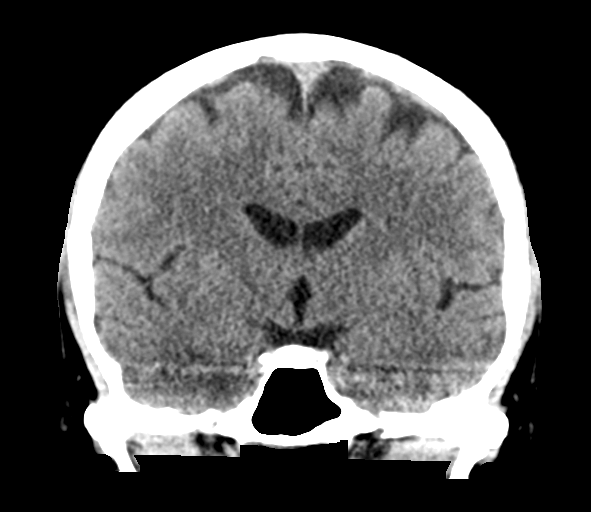

[Series 5: sagittal soft tissue · sagittal · 0.32mm/px · 3 of 57 slices shown]
[im 19/57  brain]
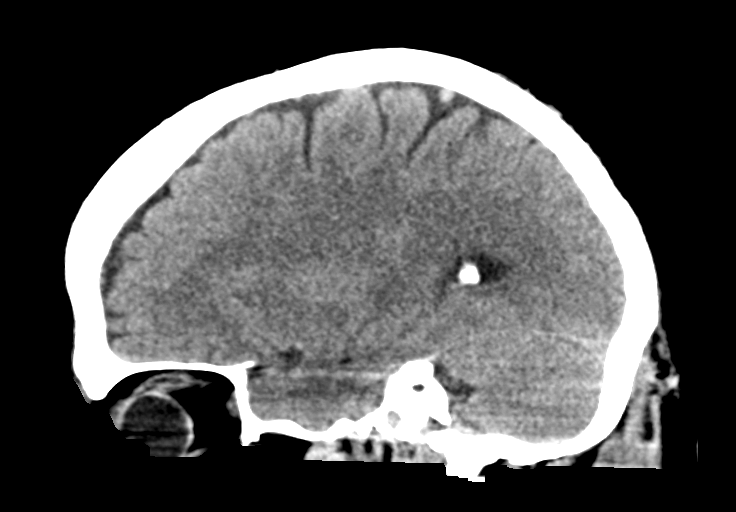
[im 29/57  brain]
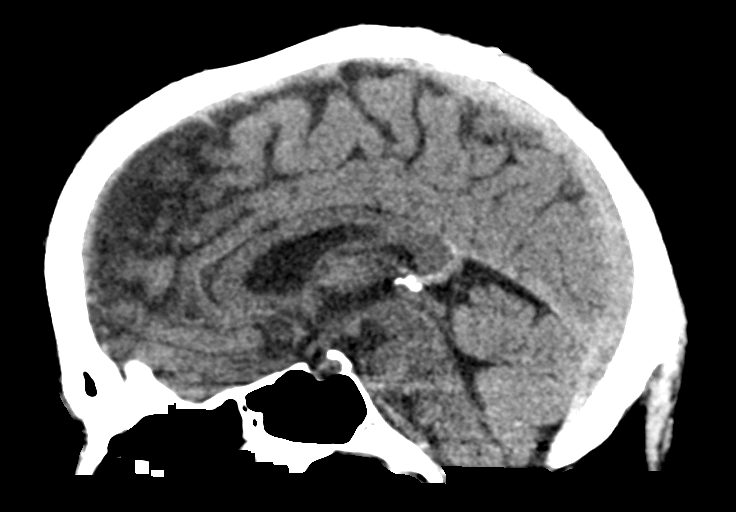
[im 38/57  brain]
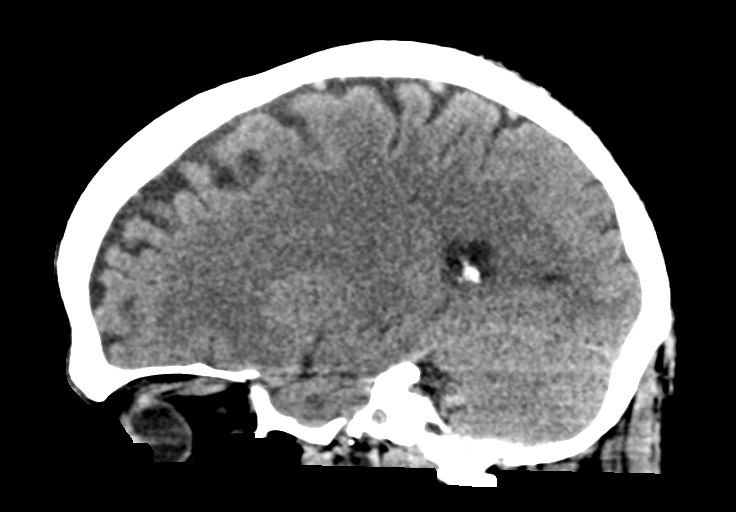

[15 of 47 positions shown; findings below may reference images not displayed]

FINDINGS: Brain: No evidence of infarction, hemorrhage, hydrocephalus,
extra-axial collection or mass lesion/mass effect.

Vascular: No hyperdense vessel or unexpected calcification.

Skull: Normal. Negative for fracture or focal lesion.

Sinuses/Orbits: No acute finding.

Other: None.
IMPRESSION: Unremarkable noncontrast head CT.

Critical Value/emergent results were called by telephone at the time
of interpretation on 06/06/2016 at [DATE] to Dr. Rehab , who
verbally acknowledged these results.

## 2018-01-19 ENCOUNTER — Encounter: Payer: Self-pay | Admitting: Family Medicine

## 2018-01-19 ENCOUNTER — Ambulatory Visit
Admission: RE | Admit: 2018-01-19 | Discharge: 2018-01-19 | Disposition: A | Discharge status: Home / Self Care | Payer: 59 | Source: Ambulatory Visit | Attending: Family Medicine | Admitting: Family Medicine

## 2018-01-19 ENCOUNTER — Ambulatory Visit (INDEPENDENT_AMBULATORY_CARE_PROVIDER_SITE_OTHER): Payer: 59 | Admitting: Family Medicine

## 2018-01-19 ENCOUNTER — Other Ambulatory Visit: Payer: Self-pay | Admitting: Family Medicine

## 2018-01-19 ENCOUNTER — Telehealth: Payer: Self-pay

## 2018-01-19 VITALS — BP 112/64 | HR 72 | Temp 98.2°F | Ht 68.0 in | Wt 271.5 lb

## 2018-01-19 DIAGNOSIS — R1030 Lower abdominal pain, unspecified: Secondary | ICD-10-CM | POA: Insufficient documentation

## 2018-01-19 DIAGNOSIS — K449 Diaphragmatic hernia without obstruction or gangrene: Secondary | ICD-10-CM | POA: Diagnosis not present

## 2018-01-19 DIAGNOSIS — K921 Melena: Secondary | ICD-10-CM

## 2018-01-19 DIAGNOSIS — K529 Noninfective gastroenteritis and colitis, unspecified: Secondary | ICD-10-CM | POA: Insufficient documentation

## 2018-01-19 DIAGNOSIS — R197 Diarrhea, unspecified: Secondary | ICD-10-CM

## 2018-01-19 DIAGNOSIS — K8020 Calculus of gallbladder without cholecystitis without obstruction: Secondary | ICD-10-CM | POA: Insufficient documentation

## 2018-01-19 LAB — POCT I-STAT CREATININE: CREATININE: 1 mg/dL (ref 0.61–1.24)

## 2018-01-19 MED ORDER — IOPAMIDOL (ISOVUE-300) INJECTION 61%
100.0000 mL | Freq: Once | INTRAVENOUS | Status: AC | PRN
  Administered 2018-01-19: 100 mL via INTRAVENOUS

## 2018-01-19 MED ORDER — DICYCLOMINE HCL 20 MG PO TABS: 20.0000 mg | ORAL_TABLET | Freq: Three times a day (TID) | ORAL | 0 refills | Status: DC

## 2018-01-19 NOTE — Assessment & Plan Note (Signed)
Eval for diverticulititis and rule out other noninfectious causes of abd pain.

## 2018-01-19 NOTE — Patient Instructions (Addendum)
Push fluids.Marland Kitchen gatorade etc.  Stop at lab for  Stool eval. Please stop at the front desk to set up referral for CT abdomen/pelvis. Go to ER if severe abdominal pain or increase in blood in stool. Contact Dr. Allen Norris for additional recommendations.

## 2018-01-19 NOTE — Assessment & Plan Note (Addendum)
Recent diarrhea due to norovirus... Now with recurrent diarrhea, now bloody.  Will re-eval pathogen panel.  Recent trip. No high risk exposures but did eat out.

## 2018-01-19 NOTE — Progress Notes (Signed)
Rx for bentyl sent in.

## 2018-01-19 NOTE — Progress Notes (Signed)
   Subjective:    Patient ID: Adam Patterson, male    DOB: March 20, 1961, 57 y.o.   MRN: [id number]  HPI   57 year old male pt of Dr. Darnell Level presents with 24 hours of worsening abdominal cramping, pain and diarrhea. HX of chronic diarrhea since 11/2017. Has had loose stool.Marland Kitchen 4 BMs an hour.  Has now noted blood (had not checked before).. Few drops to " tomato juice stool"  6 weeks ago: Tested positive for Norovirus.   Colonoscopy 12/18/2017: Dr. Allen Norris. Done for chronic diarrhea. Never done completely.  Diverculosis, polyps. NO hemorrhoids seen on colonoscopy.  In last 4 weeks he has been in [place] visiting family. Ate out. IN first week there had milder diarrhea improved with Immodium. Last 3 weeks of trip everything was.  He reports generalized abdominal pain ( pain scale 4-8 or 9)  in last 24 hours, low general, cramping, constant diarrhea diarrhea.  No vomiting.    Social History /Family History/Past Medical History reviewed in detail and updated in EMR if needed. Blood pressure 112/64, pulse 72, temperature 98.2 F (36.8 C), temperature source Oral, height 5\' 8"  (1.727 m), weight 271 lb 8 oz (123.2 kg), SpO2 98 %.    Wt Readings from Last 3 Encounters:  01/19/18 271 lb 8 oz (123.2 kg)  12/18/17 266 lb (120.7 kg)  12/14/17 277 lb (125.6 kg)     Review of Systems  Constitutional: Negative for fatigue and fever.  HENT: Negative for ear pain.   Eyes: Negative for pain.  Respiratory: Negative for cough and shortness of breath.   Cardiovascular: Negative for chest pain, palpitations and leg swelling.  Gastrointestinal: Positive for abdominal pain, blood in stool, diarrhea and nausea. Negative for rectal pain.  Genitourinary: Negative for dysuria.  Musculoskeletal: Negative for arthralgias.  Neurological: Negative for syncope, light-headedness and headaches.  Psychiatric/Behavioral: Negative for dysphoric mood.       Objective:   Physical Exam  Constitutional: Vital  signs are normal. He appears well-developed and well-nourished.  Nontoxic appearing male in NAD  central obesity  HENT:  Head: Normocephalic.  Right Ear: Hearing normal.  Left Ear: Hearing normal.  Nose: Nose normal.  Mouth/Throat: Oropharynx is clear and moist and mucous membranes are normal.  Neck: Trachea normal. Carotid bruit is not present. No thyroid mass and no thyromegaly present.  Cardiovascular: Normal rate, regular rhythm and normal pulses. Exam reveals no gallop, no distant heart sounds and no friction rub.  No murmur heard. No peripheral edema  Pulmonary/Chest: Effort normal and breath sounds normal. No respiratory distress.  Abdominal: There is no hepatosplenomegaly. There is generalized tenderness. There is no rigidity, no rebound, no guarding, no tenderness at McBurney's point and negative Murphy's sign. No hernia.  Skin: Skin is warm, dry and intact. No rash noted.  Psychiatric: He has a normal mood and affect. His speech is normal and behavior is normal. Thought content normal.          Assessment & Plan:

## 2018-01-19 NOTE — Addendum Note (Signed)
Addended by: Lendon Collar on: 01/19/2018 10:49 AM   Modules accepted: Orders

## 2018-01-19 NOTE — Telephone Encounter (Signed)
Stephanie CT at [facility] called report for CT of abdomen/pelvis. Pt is waiting. Dr Diona Browner will speak with Colletta [place].

## 2018-01-19 NOTE — Addendum Note (Signed)
Addended by: Ellamae Sia on: 01/19/2018 11:13 AM   Modules accepted: Orders

## 2018-01-19 NOTE — Assessment & Plan Note (Signed)
No hemorrhoids listed on colonoscopy as possible source.. ? Due to infection vs other source. 1 month out from colonoscopy so not likely complication of procedure.

## 2018-01-22 ENCOUNTER — Telehealth: Payer: Self-pay | Admitting: Gastroenterology

## 2018-01-22 NOTE — Telephone Encounter (Signed)
Spoke with pt regarding his abdominal pain. Pt stated he has been having cramping, pain and diarrhea lately. He was given a rx for Dicyclomine 20mg  TID. He said it's helping some. A GI panel test is still pending.

## 2018-01-22 NOTE — Telephone Encounter (Signed)
ptleft vm for Dr. Allen Norris or Nurse he states he been taking rx for stomach colitis since Tuesday afternoon and the blood in stool has stopped but is cramping and having abdominal pain please call pt he states he has not been able to due much due to the cramping

## 2018-01-25 LAB — GASTROINTESTINAL PATHOGEN PANEL PCR
C. difficile Tox A/B, PCR: NOT DETECTED
CAMPYLOBACTER, PCR: NOT DETECTED
Cryptosporidium, PCR: NOT DETECTED
E COLI 0157, PCR: NOT DETECTED
E coli (ETEC) LT/ST PCR: NOT DETECTED
E coli (STEC) stx1/stx2, PCR: NOT DETECTED
GIARDIA LAMBLIA, PCR: NOT DETECTED
NOROVIRUS, PCR: NOT DETECTED
Rotavirus A, PCR: NOT DETECTED
SHIGELLA, PCR: NOT DETECTED
Salmonella, PCR: DETECTED — AB

## 2018-01-26 NOTE — Telephone Encounter (Signed)
Please review recent GI panel results.

## 2018-01-27 NOTE — Telephone Encounter (Signed)
Discussed with pt about this recent GI panel results. Pt stated he was still having loose stools. He thought it had improved but as the day has progressed it has returned. No fever, chills. Some cramping and abdominal pain (not as bad as before). Since he is still having diarrhea, will you prescribe an antibiotic or should his PCP?

## 2018-01-27 NOTE — Telephone Encounter (Signed)
The patient had a GI PCR on previously that showed nor virus and then he underwent his colonoscopy and symptoms had improved.  I believe the patient then went to [place] and came back with diarrhea and now his stool show he has an infection with salmonella.  This is a self-limiting disease and him and his close contact should worsen her hands thoroughly.  If he is still having diarrhea or sick then he could be started on antibiotics although this can prolong the time he carries the bacteria.

## 2018-04-01 DIAGNOSIS — I25119 Atherosclerotic heart disease of native coronary artery with unspecified angina pectoris: Secondary | ICD-10-CM | POA: Diagnosis not present

## 2018-04-01 DIAGNOSIS — I10 Essential (primary) hypertension: Secondary | ICD-10-CM | POA: Diagnosis not present

## 2018-04-01 DIAGNOSIS — E782 Mixed hyperlipidemia: Secondary | ICD-10-CM | POA: Diagnosis not present

## 2018-04-01 DIAGNOSIS — Q245 Malformation of coronary vessels: Secondary | ICD-10-CM | POA: Diagnosis not present

## 2018-04-05 DIAGNOSIS — H524 Presbyopia: Secondary | ICD-10-CM | POA: Diagnosis not present

## 2018-08-02 ENCOUNTER — Other Ambulatory Visit: Payer: Self-pay | Admitting: Family Medicine

## 2018-08-02 NOTE — Telephone Encounter (Signed)
Last office visit 01/19/2018 with Dr. Diona Browner for diarrhea.  Last visit with PCP 09/16/2017 AVS states to follow up in 03/2018. Last refilled 12/16/2017 for #90 with 1 refill.   No future appointments.  Refill?

## 2018-09-16 ENCOUNTER — Encounter: Payer: Self-pay | Admitting: Family Medicine

## 2018-09-19 NOTE — Telephone Encounter (Signed)
plz call and schedule OV f/u Depression

## 2018-09-20 NOTE — Telephone Encounter (Signed)
Left message on vm per dpr asking pt to call back and schedule f/u OV for depression.

## 2018-09-21 NOTE — Telephone Encounter (Signed)
Left message on vm per dpr asking pt to call back and schedule f/u OV for depression.

## 2018-09-22 ENCOUNTER — Other Ambulatory Visit: Payer: Self-pay

## 2018-09-22 ENCOUNTER — Encounter: Payer: Self-pay | Admitting: Family Medicine

## 2018-09-22 ENCOUNTER — Ambulatory Visit (INDEPENDENT_AMBULATORY_CARE_PROVIDER_SITE_OTHER): Payer: 59 | Admitting: Family Medicine

## 2018-09-22 VITALS — BP 136/80 | HR 56 | Temp 98.2°F | Ht 68.0 in | Wt 297.1 lb

## 2018-09-22 DIAGNOSIS — E118 Type 2 diabetes mellitus with unspecified complications: Secondary | ICD-10-CM

## 2018-09-22 DIAGNOSIS — E1165 Type 2 diabetes mellitus with hyperglycemia: Secondary | ICD-10-CM | POA: Diagnosis not present

## 2018-09-22 DIAGNOSIS — F332 Major depressive disorder, recurrent severe without psychotic features: Secondary | ICD-10-CM | POA: Insufficient documentation

## 2018-09-22 DIAGNOSIS — IMO0002 Reserved for concepts with insufficient information to code with codable children: Secondary | ICD-10-CM

## 2018-09-22 DIAGNOSIS — Z23 Encounter for immunization: Secondary | ICD-10-CM

## 2018-09-22 DIAGNOSIS — E113393 Type 2 diabetes mellitus with moderate nonproliferative diabetic retinopathy without macular edema, bilateral: Secondary | ICD-10-CM | POA: Diagnosis not present

## 2018-09-22 LAB — POCT GLYCOSYLATED HEMOGLOBIN (HGB A1C): Hemoglobin A1C: 10.4 % — AB (ref 4.0–5.6)

## 2018-09-22 MED ORDER — CITALOPRAM HYDROBROMIDE 10 MG PO TABS: 10.0000 mg | ORAL_TABLET | Freq: Every day | ORAL | 6 refills | Status: DC

## 2018-09-22 MED ORDER — CITALOPRAM HYDROBROMIDE 20 MG PO TABS: 20.0000 mg | ORAL_TABLET | Freq: Every day | ORAL | 6 refills | Status: DC

## 2018-09-22 NOTE — Telephone Encounter (Signed)
Pt scheduled for 09/22/18 at 3:15 PM.

## 2018-09-22 NOTE — Assessment & Plan Note (Signed)
Check A1c today. Reassess at 1 mo f/u visit.

## 2018-09-22 NOTE — Telephone Encounter (Signed)
Will see today. Thanks
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
## 2018-09-22 NOTE — Patient Instructions (Addendum)
Flu shot today I'm sorry you've had a rough time these last few months! Stop prozac, start celexa 20mg  daily.  Check on employee assistance center counseling - if unable to do there, mychart me and I will refer you to counselor at our office.  If any bad thoughts developing, seek immediate care.  A1c today.  Return 1 mo f/u visit, return for physical at your convenience.   Major Depressive Disorder, Adult Major depressive disorder (MDD) is a mental health condition. MDD often makes you feel sad, hopeless, or helpless. MDD can also cause symptoms in your body. MDD can affect your:  Work.  School.  Relationships.  Other normal activities. MDD can range from mild to very bad. It may occur once (single episode MDD). It can also occur many times (recurrent MDD). The main symptoms of MDD often include:  Feeling sad, depressed, or irritable most of the time.  Loss of interest. MDD symptoms also include:  Sleeping too much or too little.  Eating too much or too little.  A change in your weight.  Feeling tired (fatigue) or having low energy.  Feeling worthless.  Feeling guilty.  Trouble making decisions.  Trouble thinking clearly.  Thoughts of suicide or harming others.  Feeling weak.  Feeling agitated.  Keeping yourself from being around other people (isolation). Follow these instructions at home: Activity  Do these things as told by your doctor: ? Go back to your normal activities. ? Exercise regularly. ? Spend time outdoors. Alcohol  Talk with your doctor about how alcohol can affect your antidepressant medicines.  Do not drink alcohol. Or, limit how much alcohol you drink. ? This means no more than 1 drink a day for nonpregnant women and 2 drinks a day for men. One drink equals one of these:  12 oz of beer.  5 oz of wine.  1 oz of hard liquor. General instructions  Take over-the-counter and prescription medicines only as told by your doctor.  Eat a  healthy diet.  Get plenty of sleep.  Find activities that you enjoy. Make time to do them.  Think about joining a support group. Your doctor may be able to suggest a group for you.  Keep all follow-up visits as told by your doctor. This is important. Where to find more information:  [employer] on Mental Illness: ? www.nami.[place]: ? https://carter.com/  National Suicide Prevention Lifeline: ? 580-497-4520. This is free, 24-hour help. Contact a doctor if:  Your symptoms get worse.  You have new symptoms. Get help right away if:  You self-harm.  You see, hear, taste, smell, or feel things that are not present (hallucinate). If you ever feel like you may hurt yourself or others, or have thoughts about taking your own life, get help right away. You can go to your nearest emergency department or call:  Your local emergency services (911 in the [place]  A suicide crisis helpline, such as the National Suicide Prevention Lifeline: ? 780 132 5000. This is open 24 hours a day. This information is not intended to replace advice given to you by your health care provider. Make sure you discuss any questions you have with your health care provider. Document Released: 06/11/2015 Document Revised: 03/16/2016 Document Reviewed: 03/16/2016 Elsevier Interactive Patient Education  2019 [employer].

## 2018-09-27 ENCOUNTER — Other Ambulatory Visit: Payer: Self-pay | Admitting: Family Medicine

## 2018-10-04 ENCOUNTER — Other Ambulatory Visit: Payer: Self-pay | Admitting: Family Medicine

## 2018-10-05 ENCOUNTER — Encounter: Payer: Self-pay | Admitting: Family Medicine

## 2018-10-09 IMAGING — DX DG ANKLE COMPLETE 3+V*R*
3 series · 3 of 3 positions shown · non-contrast
Comparison: None.

CLINICAL DATA: Acute right ankle pain following fall today. Initial
encounter.

EXAM:
RIGHT ANKLE - COMPLETE 3+ VIEW

[ankle ap]
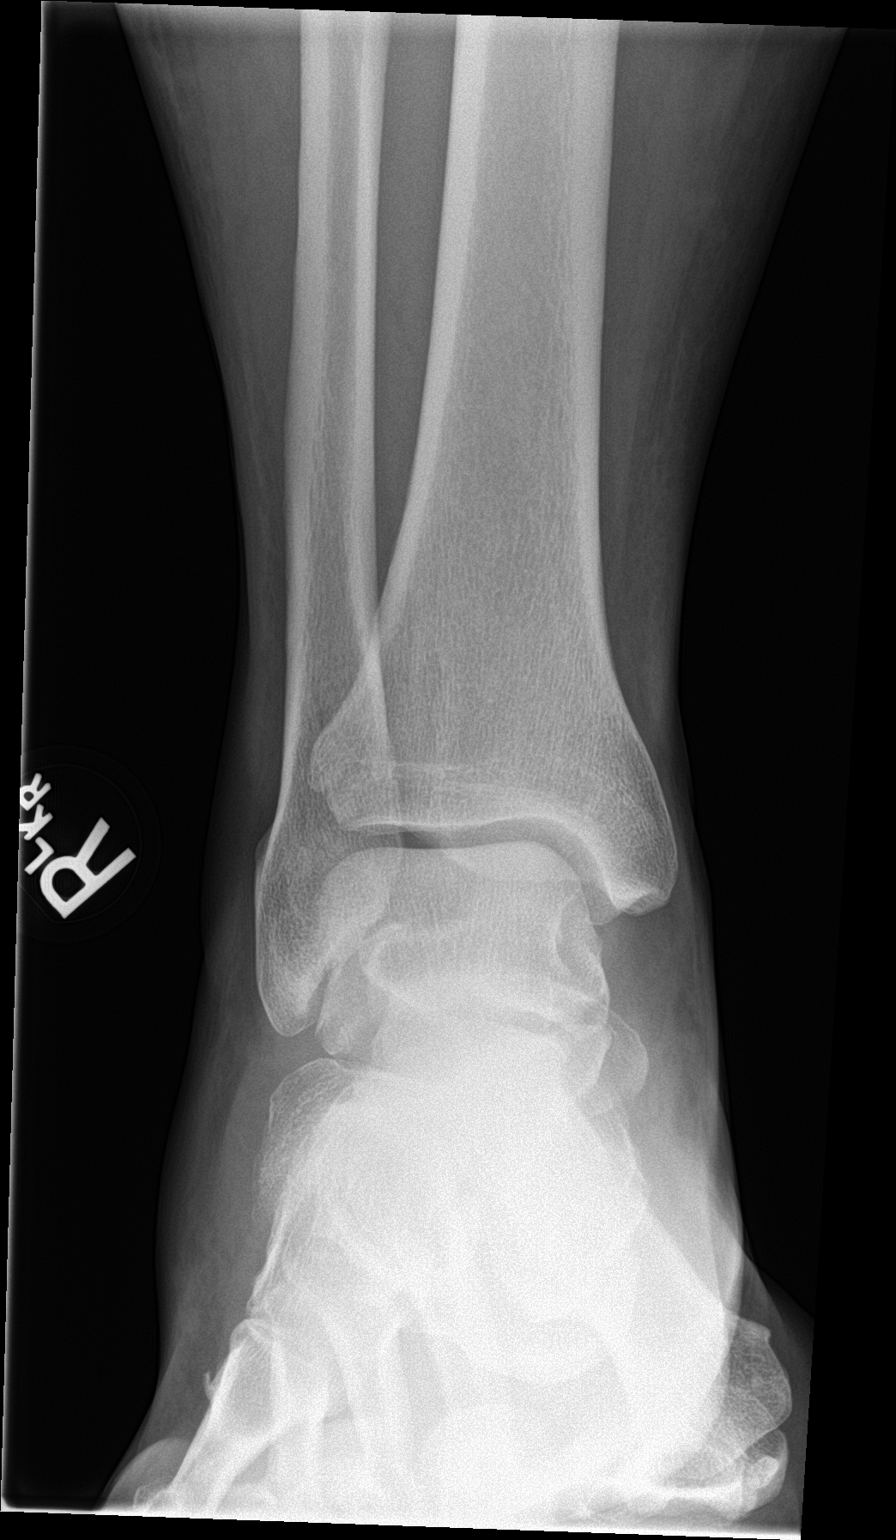

[ankle obl]
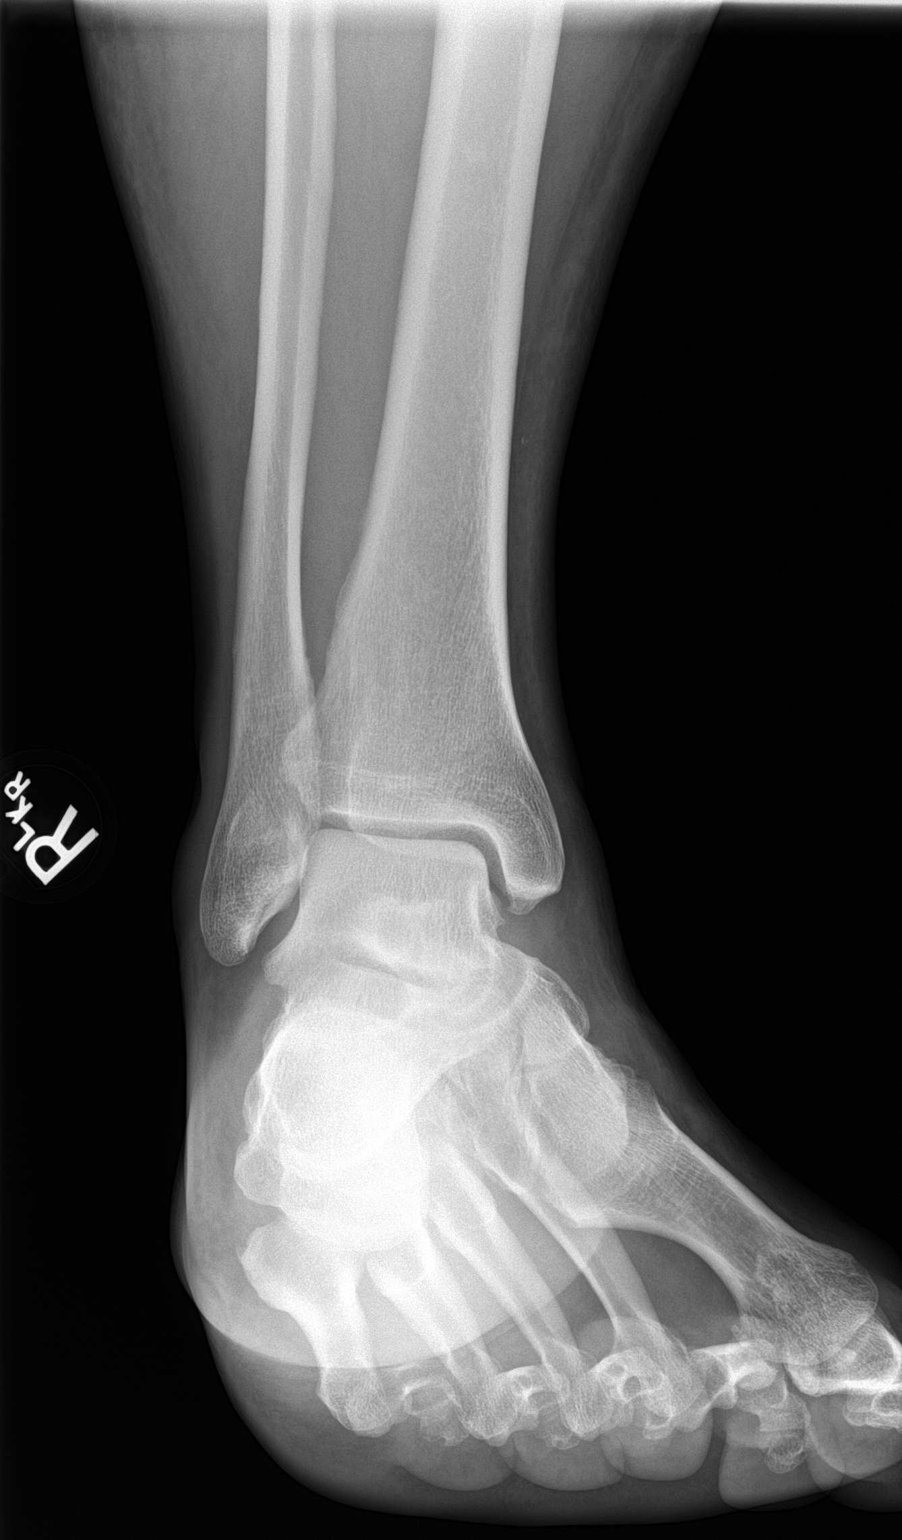

[ankle lat]
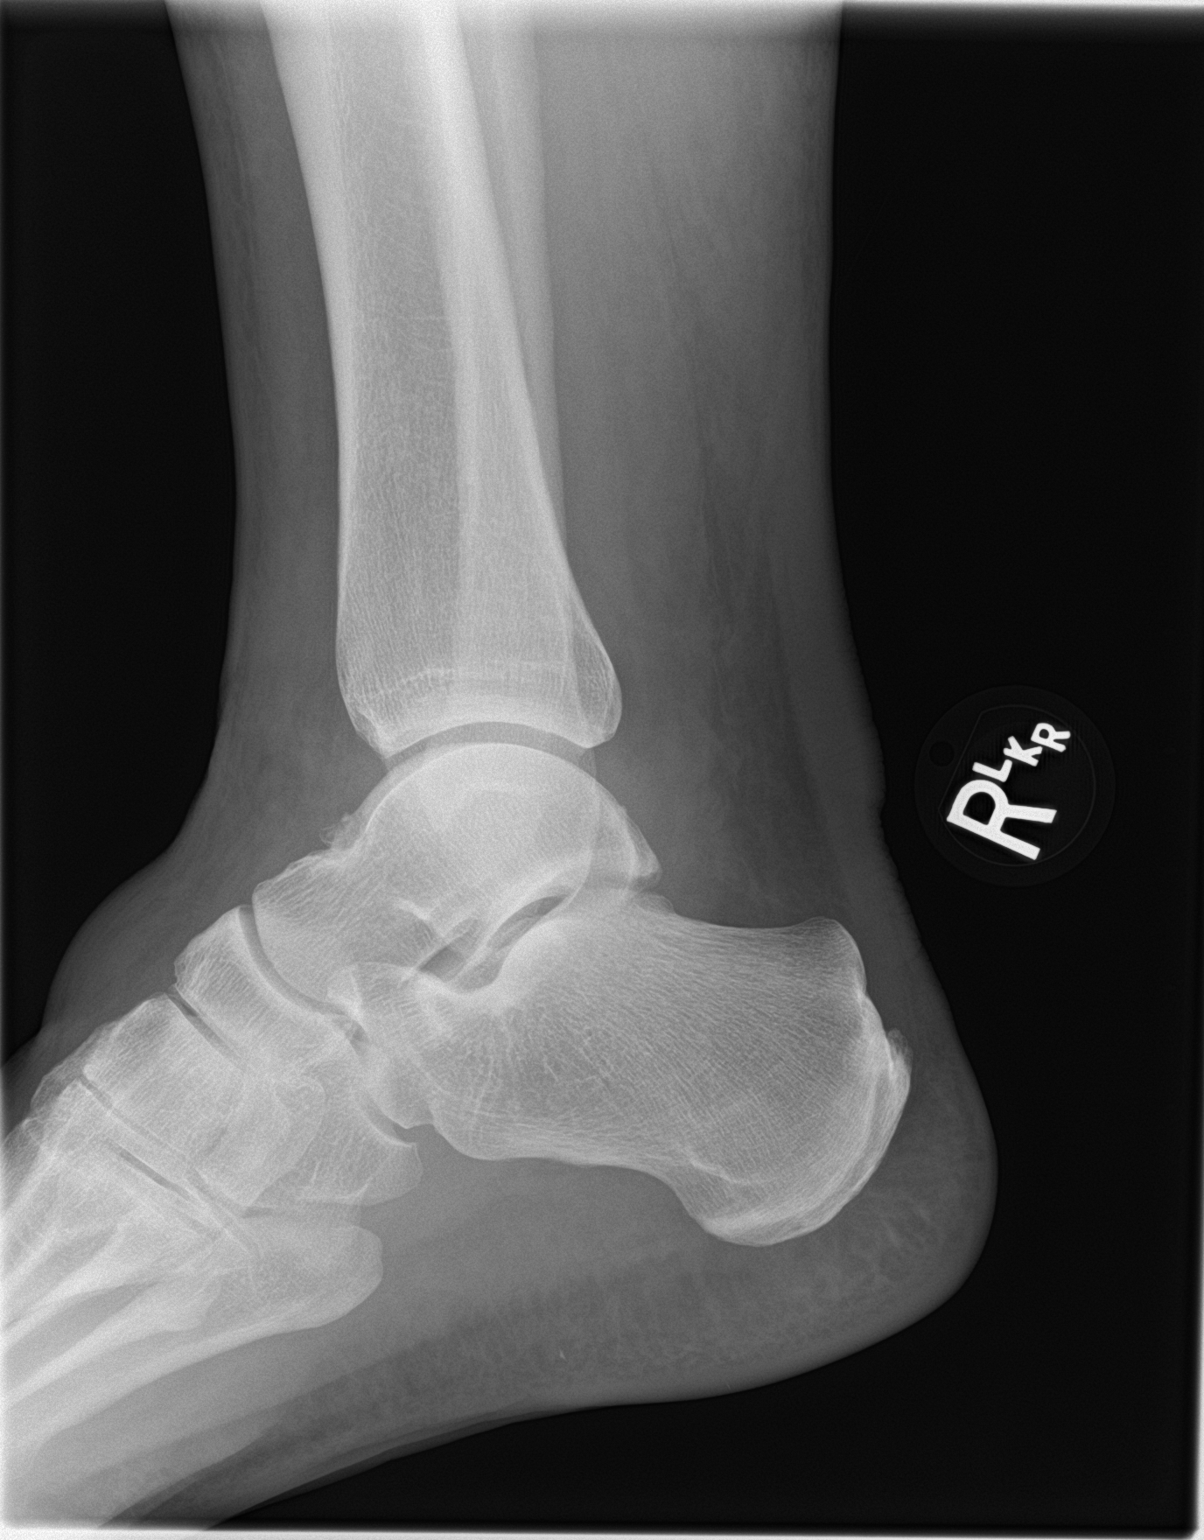

[3 of 3 positions shown; findings below may reference images not displayed]

FINDINGS: There is no evidence of fracture, dislocation, or joint effusion.
There is no evidence of arthropathy or other focal bone abnormality.

Soft tissue swelling noted.
IMPRESSION: Soft tissue swelling without acute bony abnormality.

## 2018-10-13 ENCOUNTER — Encounter: Payer: Self-pay | Admitting: Family Medicine

## 2018-10-15 NOTE — Telephone Encounter (Signed)
Spoke to pt he stated he was driving and would call back to schedule

## 2018-10-15 NOTE — Telephone Encounter (Signed)
plz schedule virtual visit for mood f/u for patient for 1-2 wks (up to pt) thanks

## 2018-10-18 ENCOUNTER — Encounter: Payer: Self-pay | Admitting: Family Medicine

## 2018-10-18 DIAGNOSIS — F332 Major depressive disorder, recurrent severe without psychotic features: Secondary | ICD-10-CM

## 2018-10-18 NOTE — Telephone Encounter (Signed)
Doxy[facility]me appointment 4/14

## 2018-10-18 NOTE — Telephone Encounter (Signed)
Referral to psych per pt request.

## 2018-10-22 ENCOUNTER — Encounter: Payer: Self-pay | Admitting: Family Medicine

## 2018-10-26 ENCOUNTER — Ambulatory Visit (INDEPENDENT_AMBULATORY_CARE_PROVIDER_SITE_OTHER): Payer: 59 | Admitting: Family Medicine

## 2018-10-26 ENCOUNTER — Encounter: Payer: Self-pay | Admitting: Family Medicine

## 2018-10-26 VITALS — Ht 68.0 in | Wt 283.0 lb

## 2018-10-26 DIAGNOSIS — Z6841 Body Mass Index (BMI) 40.0 and over, adult: Secondary | ICD-10-CM

## 2018-10-26 DIAGNOSIS — E118 Type 2 diabetes mellitus with unspecified complications: Secondary | ICD-10-CM

## 2018-10-26 DIAGNOSIS — IMO0002 Reserved for concepts with insufficient information to code with codable children: Secondary | ICD-10-CM

## 2018-10-26 DIAGNOSIS — F332 Major depressive disorder, recurrent severe without psychotic features: Secondary | ICD-10-CM

## 2018-10-26 DIAGNOSIS — E1165 Type 2 diabetes mellitus with hyperglycemia: Secondary | ICD-10-CM

## 2018-10-26 NOTE — Assessment & Plan Note (Signed)
Reviewed healthy diet changes with endorsed resultant 14 lb weight loss! Pt motivated to continue healthy changes.

## 2018-10-26 NOTE — Assessment & Plan Note (Signed)
Overall improved by PHQ9. Continues celexa 10mg  daily - will titrate to 20mg  at next refill, monitoring for dizzy symptoms.  Implementing structure in his daily routine has helped.  Reviewed stress relieving strategies, support provided. Continues seeing counselor through [facility] weekly.  Upcoming psychiatry appointment later this week.

## 2018-10-26 NOTE — Progress Notes (Signed)
Virtual visit completed through [employer]. Due to national recommendations of social distancing due to [place] 19, a virtual visit is felt to be most appropriate for this patient at this time.   Patient location: home Provider location: [place] at [facility], office If any vitals were documented, they were collected by patient at home unless specified below.    Ht 5\' 8"  (1.727 m)   Wt 283 lb (128.4 kg)   BMI 43.03 kg/m    CC: depression f/u Subjective:    Patient ID: Adam Patterson, male    DOB: 1961/06/17, 58 y.o.   MRN: [id number]  HPI: VISHAAL Patterson is a 58 y.o. male presenting on 10/26/2018 for Depression (1 mo f/u.)   Patient overslept 8am appt - rescheduled for 10am.   See prior note for details.  Longstanding MDD acutely worse over last several months. Previously on cymbalta and prozac 20mg , but had been off cymbalta for period of time (may have worsened panic symptoms). Last visit we restarted celexa 20mg  daily - but he decreased to 10mg  after noticing increased dizziness.   Continues seeing [facility] psychologist ([facility]) every Monday.   To establish with psychiatrist - has appt this week.   Does occasionally check BP at home. Forgets to check sugar. Planning to start walking at track.   He has stopped Ubering.  Continues working on voice over acting - has established new schedule wakes up at 5am. New daytime routine/structure has helped.  Also increased water intake - drinking 2 gallons of water a day. 14 lb weight loss with decreased soda intake!  DM - uncontrolled based on last A1c. He has made healthy diet changes. cbg yesterday 207 after sugared coffee.  Lab Results  Component Value Date   HGBA1C 10.4 (A) 09/22/2018         Relevant past medical, surgical, family and social history reviewed and updated as indicated. Interim medical history since our last visit reviewed. Allergies and medications reviewed and updated. Outpatient Medications Prior  to Visit  Medication Sig Dispense Refill  . amLODipine (NORVASC) 10 MG tablet TAKE 1 TABLET (10 MG TOTAL) BY MOUTH DAILY. 90 tablet 1  . aspirin 325 MG tablet Take 325 mg by mouth daily.    . carvedilol (COREG) 6.25 MG tablet Take 1 tablet (6.25 mg total) by mouth 2 (two) times daily with a meal. 180 tablet 3  . Cholecalciferol (VITAMIN D3) 2000 UNITS TABS Take 2,000 Units by mouth 2 (two) times daily.     . citalopram (CELEXA) 20 MG tablet Take 0.5 tablets (10 mg total) by mouth daily.    . fish oil-omega-3 fatty acids 1000 MG capsule Take 1 g by mouth 2 (two) times daily.     . hydrochlorothiazide (HYDRODIURIL) 25 MG tablet TAKE 1 TABLET BY MOUTH DAILY WITH LOSARTAN 90 tablet 1  . losartan (COZAAR) 100 MG tablet TAKE 1 TABLET BY MOUTH DAILY WITH HCTZ 90 tablet 1  . Magnesium 250 MG TABS Take 250 mg by mouth 2 (two) times daily.    . metFORMIN (GLUCOPHAGE) 1000 MG tablet Take 1 tablet (1,000 mg total) by mouth 2 (two) times daily. Needs annual physical appointment for further refills 180 tablet 3  . nitroGLYCERIN (NITROSTAT) 0.4 MG SL tablet Place 1 tablet (0.4 mg total) under the tongue every 5 (five) minutes x 3 doses as needed for chest pain. 25 tablet 1  . rosuvastatin (CRESTOR) 40 MG tablet TAKE 1 TABLET (40 MG TOTAL) BY MOUTH DAILY.  90 tablet 1  . vitamin B-12 (CYANOCOBALAMIN) 1000 MCG tablet Take 1,000 mcg by mouth 2 (two) times daily.    Mar[place] Kitchen losartan-hydrochlorothiazide (HYZAAR) 100-25 MG tablet Take 1 tablet by mouth daily. (Patient not taking: Reported on 10/26/2018) 90 tablet 3  . spironolactone (ALDACTONE) 25 MG tablet Take 1 tablet (25 mg total) by mouth every morning. 30 tablet 3   No facility-administered medications prior to visit.      Per HPI unless specifically indicated in ROS section below Review of Systems Objective:    Ht 5\' 8"  (1.727 m)   Wt 283 lb (128.4 kg)   BMI 43.03 kg/m   Wt Readings from Last 3 Encounters:  10/26/18 283 lb (128.4 kg)  09/22/18 297 lb 2 oz  (134.8 kg)  01/19/18 271 lb 8 oz (123.2 kg)     Physical exam: Gen: alert, NAD, not ill appearing Pulm: speaks in complete sentences without increased work of breathing Psych: normal mood, normal thought content      Results for orders placed or performed in visit on 09/22/18  POCT glycosylated hemoglobin (Hb A1C)  Result Value Ref Range   Hemoglobin A1C 10.4 (A) 4.0 - 5.6 %   HbA1c POC (<> result, manual entry)     HbA1c, POC (prediabetic range)     HbA1c, POC (controlled diabetic range)     Depression screen [facility] 2/9 10/26/2018 09/22/2018 09/16/2017 02/13/2016  Decreased Interest 2 3 0 0  Down, Depressed, Hopeless 1 3 0 1  PHQ - 2 Score 3 6 0 1  Altered sleeping 3 3 - 0  Tired, decreased energy 3 3 - 0  Change in appetite 0 3 - -  Feeling bad or failure about yourself  2 3 - 3  Trouble concentrating 1 3 - 0  Moving slowly or fidgety/restless 0 3 - 0  Suicidal thoughts 2 3 - 0  PHQ-9 Score 14 27 - 4    Assessment & Plan:  Keep upcoming physical appt, will evaluate at that time if we can do in-office visit. Problem List Items Addressed This Visit    MDD (major depressive disorder), recurrent episode, severe ([place]) - Primary    Overall improved by PHQ9. Continues celexa 10mg  daily - will titrate to 20mg  at next refill, monitoring for dizzy symptoms.  Implementing structure in his daily routine has helped.  Reviewed stress relieving strategies, support provided. Continues seeing counselor through [facility] weekly.  Upcoming psychiatry appointment later this week.       Diabetes mellitus type 2, uncontrolled, with complications ([place])    Has implemented healthy diet and lifestyle changes. Only on metformin. Advised to start regularly checking sugars BID and update me if consistently above goal to add second diabetic medication. Pt agrees with plan.       Body mass index (BMI) of 40.0-44.9 in adult [facility])    Reviewed healthy diet changes with endorsed resultant 14 lb weight loss! Pt  motivated to continue healthy changes.          No orders of the defined types were placed in this encounter.  No orders of the defined types were placed in this encounter.   Follow up plan: No follow-ups on file.  Ria Bush, MD

## 2018-10-26 NOTE — Assessment & Plan Note (Signed)
Has implemented healthy diet and lifestyle changes. Only on metformin. Advised to start regularly checking sugars BID and update me if consistently above goal to add second diabetic medication. Pt agrees with plan.

## 2018-11-09 ENCOUNTER — Other Ambulatory Visit: Payer: Self-pay | Admitting: Family Medicine

## 2018-11-25 ENCOUNTER — Other Ambulatory Visit: Payer: Self-pay

## 2018-11-29 ENCOUNTER — Encounter: Payer: Self-pay | Admitting: Family Medicine

## 2018-11-30 ENCOUNTER — Other Ambulatory Visit: Payer: Self-pay | Admitting: Family Medicine

## 2019-01-05 ENCOUNTER — Encounter: Payer: Self-pay | Admitting: Family Medicine

## 2019-01-05 NOTE — Telephone Encounter (Signed)
plz schedule virtual visit for tomorrow.

## 2019-01-06 ENCOUNTER — Telehealth (INDEPENDENT_AMBULATORY_CARE_PROVIDER_SITE_OTHER): Payer: 59 | Admitting: Family Medicine

## 2019-01-06 ENCOUNTER — Encounter: Payer: Self-pay | Admitting: Family Medicine

## 2019-01-06 ENCOUNTER — Telehealth: Payer: Self-pay | Admitting: General Practice

## 2019-01-06 ENCOUNTER — Telehealth: Payer: Self-pay

## 2019-01-06 VITALS — BP 132/86 | HR 56 | Temp 96.2°F | Ht 68.0 in | Wt 290.0 lb

## 2019-01-06 DIAGNOSIS — Z125 Encounter for screening for malignant neoplasm of prostate: Secondary | ICD-10-CM

## 2019-01-06 DIAGNOSIS — R5382 Chronic fatigue, unspecified: Secondary | ICD-10-CM | POA: Diagnosis not present

## 2019-01-06 DIAGNOSIS — I25110 Atherosclerotic heart disease of native coronary artery with unstable angina pectoris: Secondary | ICD-10-CM | POA: Diagnosis not present

## 2019-01-06 DIAGNOSIS — R61 Generalized hyperhidrosis: Secondary | ICD-10-CM

## 2019-01-06 DIAGNOSIS — R05 Cough: Secondary | ICD-10-CM | POA: Diagnosis not present

## 2019-01-06 DIAGNOSIS — R0609 Other forms of dyspnea: Secondary | ICD-10-CM | POA: Diagnosis not present

## 2019-01-06 DIAGNOSIS — E118 Type 2 diabetes mellitus with unspecified complications: Secondary | ICD-10-CM

## 2019-01-06 DIAGNOSIS — E785 Hyperlipidemia, unspecified: Secondary | ICD-10-CM

## 2019-01-06 DIAGNOSIS — G8929 Other chronic pain: Secondary | ICD-10-CM

## 2019-01-06 DIAGNOSIS — R42 Dizziness and giddiness: Secondary | ICD-10-CM

## 2019-01-06 DIAGNOSIS — E1165 Type 2 diabetes mellitus with hyperglycemia: Secondary | ICD-10-CM

## 2019-01-06 DIAGNOSIS — M25511 Pain in right shoulder: Secondary | ICD-10-CM | POA: Diagnosis not present

## 2019-01-06 DIAGNOSIS — R059 Cough, unspecified: Secondary | ICD-10-CM

## 2019-01-06 DIAGNOSIS — IMO0002 Reserved for concepts with insufficient information to code with codable children: Secondary | ICD-10-CM

## 2019-01-06 MED ORDER — ACCU-CHEK AVIVA VI STRP: ORAL_STRIP | 3 refills | Status: AC

## 2019-01-06 NOTE — Assessment & Plan Note (Signed)
Multiple concerns today including cough, body aches, fatigue. Given duration of symptoms doubt covid19 related but given comorbidities will send for testing.

## 2019-01-06 NOTE — Telephone Encounter (Signed)
Pt scheduled for today at 12:30 with Dr. Darnell Level.

## 2019-01-06 NOTE — Telephone Encounter (Signed)
Left message asking pt to call office 6/25/rbh

## 2019-01-06 NOTE — Assessment & Plan Note (Signed)
Chronic, recent deterioration. On full dose metformin but has not been checking sugars "I ran out of strips". Strips refilled, reviewed importance of close monitoring and glycemic control. Will update A1c with upcoming lab work

## 2019-01-06 NOTE — Assessment & Plan Note (Signed)
Chronic, describes progressive worsening. Anticipate frozen shoulder issue. Will refer to ortho for eval.

## 2019-01-06 NOTE — Assessment & Plan Note (Signed)
Sounds chronic. Discussed possible inner ear issue (peripheral vertigo) and I recommended further eval at next office visit.

## 2019-01-06 NOTE — Telephone Encounter (Signed)
Pt called stating that he was seen by his pcp and due to his symptoms, he was advised to contact our office to rule out any heart related issues: Sob, bodyaches, temp fluctuation, day/night chills, sweating, ear ringing. Please review and advise.//ah

## 2019-01-06 NOTE — Progress Notes (Signed)
Virtual visit attempted through [place]. Due to national recommendations of social distancing due to COVID-19, a virtual visit is felt to be most appropriate for this patient at this time. Reviewed limitations of a virtual visit.   Patient location: home Provider location: [place] at [facility], office If any vitals were documented, they were collected by patient at home unless specified below.    BP 132/86   Pulse (!) 56   Temp (!) 96.2 F (35.7 C) (Tympanic)   Ht 5\' 8"  (1.727 m)   Wt 290 lb (131.5 kg)   BMI 44.09 kg/m    CC: covid symptoms? Subjective:    Patient ID: Adam Patterson, male    DOB: October 23, 1960, 58 y.o.   MRN: [id number]  HPI: Adam Patterson is a 58 y.o. male presenting on 01/06/2019 for COVID-19 Concern (C/o chills, cough, fatigue and body aches. Also, c/o severe skin dryness. Sxs started about 2 mos ago. ) and Shoulder Pain (C/o throbbing right shoulder pain, worsening. Has been seen previously for pain. )   Several month history of intermittent chills, body aches especially noted with any outdoor work (ie garden), marked fatigue, day and night sweats, tinnitus, dry skin of hands and feet, vertigo with sudden head position changes. Intermittent sore throat.   Finds he gets very short winded when he push mows backyard. Has to take several breaks to be able to get through yard.   No new cough, no loss of taste or smell, HA.  Denies known exposure to [place].   Ongoing constant throbbing R shoulder pain. Some limitation in shoulder ROM. Resistance band use worsened pain.   H/o DM, recently worsened control. Taking metformin 1000mg  bid. No diarrhea with this. Has not been checking sugars for the last 2 months. Glucometer brand accu-chek avviva plus.   Mood - endorses overall stable on celexa 20mg  daily. States he's been seeing psychiatrist "shrink".  Cardiologist Dr Einar Gip, last seen 03/2018.  Requests labs drawn at [place] draw station.      Relevant past  medical, surgical, family and social history reviewed and updated as indicated. Interim medical history since our last visit reviewed. Allergies and medications reviewed and updated. Outpatient Medications Prior to Visit  Medication Sig Dispense Refill  . amLODipine (NORVASC) 10 MG tablet TAKE 1 TABLET (10 MG TOTAL) BY MOUTH DAILY. 90 tablet 1  . aspirin 325 MG tablet Take 325 mg by mouth daily.    . carvedilol (COREG) 6.25 MG tablet TAKE 1 TABLET (6.25 MG TOTAL) BY MOUTH 2 (TWO) TIMES DAILY WITH A MEAL. 180 tablet 0  . Cholecalciferol (VITAMIN D3) 2000 UNITS TABS Take 2,000 Units by mouth 2 (two) times daily.     . citalopram (CELEXA) 20 MG tablet Take 1 tablet (20 mg total) by mouth daily.    . fish oil-omega-3 fatty acids 1000 MG capsule Take 1 g by mouth 2 (two) times daily.     . hydrochlorothiazide (HYDRODIURIL) 25 MG tablet TAKE 1 TABLET BY MOUTH DAILY WITH LOSARTAN 90 tablet 1  . losartan (COZAAR) 100 MG tablet TAKE 1 TABLET BY MOUTH DAILY WITH HCTZ 90 tablet 1  . losartan-hydrochlorothiazide (HYZAAR) 100-25 MG tablet Take 1 tablet by mouth daily. (Patient not taking: Reported on 10/26/2018) 90 tablet 3  . Magnesium 250 MG TABS Take 250 mg by mouth 2 (two) times daily.    . metFORMIN (GLUCOPHAGE) 1000 MG tablet TAKE 1 TABLET (1,000 MG TOTAL) BY MOUTH 2 TIMES DAILY. 180 tablet 1  .  nitroGLYCERIN (NITROSTAT) 0.4 MG SL tablet Place 1 tablet (0.4 mg total) under the tongue every 5 (five) minutes x 3 doses as needed for chest pain. 25 tablet 1  . rosuvastatin (CRESTOR) 40 MG tablet TAKE 1 TABLET (40 MG TOTAL) BY MOUTH DAILY. 90 tablet 1  . spironolactone (ALDACTONE) 25 MG tablet Take 1 tablet (25 mg total) by mouth every morning. 30 tablet 3  . vitamin B-12 (CYANOCOBALAMIN) 1000 MCG tablet Take 1,000 mcg by mouth 2 (two) times daily.    . citalopram (CELEXA) 20 MG tablet Take 0.5 tablets (10 mg total) by mouth daily.     No facility-administered medications prior to visit.      Per HPI  unless specifically indicated in ROS section below Review of Systems Objective:    BP 132/86   Pulse (!) 56   Temp (!) 96.2 F (35.7 C) (Tympanic)   Ht 5\' 8"  (1.727 m)   Wt 290 lb (131.5 kg)   BMI 44.09 kg/m   Wt Readings from Last 3 Encounters:  01/06/19 290 lb (131.5 kg)  10/26/18 283 lb (128.4 kg)  09/22/18 297 lb 2 oz (134.8 kg)     Physical exam: Gen: alert, NAD, not ill appearing Pulm: speaks in complete sentences without increased work of breathing Psych: normal mood, normal thought content      Results for orders placed or performed in visit on 09/22/18  POCT glycosylated hemoglobin (Hb A1C)  Result Value Ref Range   Hemoglobin A1C 10.4 (A) 4.0 - 5.6 %   HbA1c POC (<> result, manual entry)     HbA1c, POC (prediabetic range)     HbA1c, POC (controlled diabetic range)     Assessment & Plan:   Problem List Items Addressed This Visit    Vertigo    Sounds chronic. Discussed possible inner ear issue (peripheral vertigo) and I recommended further eval at next office visit.       Right shoulder pain    Chronic, describes progressive worsening. Anticipate frozen shoulder issue. Will refer to ortho for eval.       Hyperlipidemia   Relevant Orders   Lipid panel   Comprehensive metabolic panel   Exertional dyspnea    Endorses progressively worsening exertional dyspnea over last several months. In setting of known CAD and recently worsened diabetic control, I did ask him to f/u with cardiology for further evaluation. He will call for eval.       Diabetes mellitus type 2, uncontrolled, with complications (HCC)    Chronic, recent deterioration. On full dose metformin but has not been checking sugars "I ran out of strips". Strips refilled, reviewed importance of close monitoring and glycemic control. Will update A1c with upcoming lab work      Relevant Orders   Hemoglobin A1c   Cough - Primary    Multiple concerns today including cough, body aches, fatigue. Given  duration of symptoms doubt covid19 related but given comorbidities will send for testing.      Chronic fatigue    Check for reversible causes of fatigue including testosterone.       Relevant Orders   Comprehensive metabolic panel   TSH   CBC with Differential/Platelet   Vitamin B12   VITAMIN D 25 Hydroxy (Vit-D Deficiency, Fractures)   Testosterone   CAD (coronary artery disease), native coronary artery    Other Visit Diagnoses    Diaphoresis       Special screening for malignant neoplasm of prostate  Relevant Orders   PSA       Meds ordered this encounter  Medications  . glucose blood (ACCU-CHEK AVIVA) test strip    Sig: Use as instructed    Dispense:  100 each    Refill:  3   Orders Placed This Encounter  Procedures  . Lipid panel    Standing Status:   Future    Number of Occurrences:   1    Standing Expiration Date:   01/06/2020  . Comprehensive metabolic panel    Standing Status:   Future    Number of Occurrences:   1    Standing Expiration Date:   01/06/2020  . TSH    Standing Status:   Future    Number of Occurrences:   1    Standing Expiration Date:   01/06/2020  . Hemoglobin A1c    Standing Status:   Future    Number of Occurrences:   1    Standing Expiration Date:   01/06/2020  . PSA    Standing Status:   Future    Number of Occurrences:   1    Standing Expiration Date:   01/06/2020  . CBC with Differential/Platelet    Standing Status:   Future    Number of Occurrences:   1    Standing Expiration Date:   01/06/2020  . Vitamin B12    Standing Status:   Future    Number of Occurrences:   1    Standing Expiration Date:   01/06/2020  . VITAMIN D 25 Hydroxy (Vit-D Deficiency, Fractures)    Standing Status:   Future    Number of Occurrences:   1    Standing Expiration Date:   01/06/2020  . Testosterone    Standing Status:   Future    Number of Occurrences:   1    Standing Expiration Date:   01/06/2020    I discussed the assessment and treatment  plan with the patient. The patient was provided an opportunity to ask questions and all were answered. The patient agreed with the plan and demonstrated an understanding of the instructions. The patient was advised to call back or seek an in-person evaluation if the symptoms worsen or if the condition fails to improve as anticipated.  Follow up plan: No follow-ups on file.  Ria Bush, MD

## 2019-01-06 NOTE — Assessment & Plan Note (Addendum)
Check for reversible causes of fatigue including testosterone.

## 2019-01-06 NOTE — Telephone Encounter (Signed)
Spoke with pt appointment 6/26.  Offered another provider today pt declined only wanted dr g

## 2019-01-06 NOTE — Telephone Encounter (Signed)
Called pt to schedule Covid testing no answer, I will try pt back for scheduling. Not sure if it was okay to lvm at [facility] sounds like a business vm.

## 2019-01-06 NOTE — Telephone Encounter (Signed)
Left message for pt to return call to schedule COVID-19 testing.

## 2019-01-06 NOTE — Assessment & Plan Note (Signed)
Endorses progressively worsening exertional dyspnea over last several months. In setting of known CAD and recently worsened diabetic control, I did ask him to f/u with cardiology for further evaluation. He will call for eval.

## 2019-01-06 NOTE — Telephone Encounter (Signed)
Please set up a visit with Adam Patterson or me either VV or OV

## 2019-01-07 ENCOUNTER — Ambulatory Visit: Payer: 59 | Admitting: Family Medicine

## 2019-01-07 NOTE — Telephone Encounter (Signed)
Pt called to schedule testing but no answer at this time.

## 2019-01-07 NOTE — Telephone Encounter (Signed)
Left v/m requesting cb to get covid testing scheduled.

## 2019-01-07 NOTE — Telephone Encounter (Addendum)
I spoke with pt;pt was approved by Dr Darnell Level on 01/06/19 for covid testing. I called pt and asked him to hold on while I checked what we could do to help him get the appt scheduled; pt said no he was not going to hold and was tired of going from one person to another;If I get the appt taken care of call the pt back and pt hung up while I was responding. I called [phone] and spoke with Adam Paganini RN who I was going to conference the call with the pt so he would not have to wait on a cb. Tried to call pt x 3 and called [name] (DPR signed) no answer and I left v/m for pt to let him know what I had attempted to do by conference call.Adam Patterson said she would try to contact pt again later. FYI to Dr Darnell Level.

## 2019-01-10 ENCOUNTER — Other Ambulatory Visit: Payer: Self-pay | Admitting: Family Medicine

## 2019-01-10 NOTE — Telephone Encounter (Signed)
Please call and arrange. 

## 2019-01-11 NOTE — Telephone Encounter (Signed)
Called patient again, went to a rather contentious voicemail.  I called wife who answered.  Advised we have been trying to reach patient to schedule Covid testing. Asked her to notify him to call our office if he wants to get tested.  She states patient is upset, but will try and get him to call our office

## 2019-01-11 NOTE — Telephone Encounter (Signed)
Tried to call pt and wife, unable to reach either. Will try later.

## 2019-01-14 NOTE — Telephone Encounter (Addendum)
Have tried to contact patient multiple times this past week without success.  I spoke with his wife earlier this week who said he was ok, just upset.  After his latest message, I will try and contact his wife again. My goal is to ensure his safety.  No answer. Left message. Will call again later.

## 2019-01-14 NOTE — Telephone Encounter (Signed)
Spoke w wife - discussed my concerns with his voicemail and mychart message.  She asked him to talk to me but he refused.  I spoke about his passive SI via mychart, she assured me he would never hurt himself. Advised if SI then recommended 911 or ER evaluation or return to psychiatrist for antidepressant adjustment. Reviewed pt safety is of paramount importance. She states she will keep close eye on him over weekend. I will again try to touch base with him next week, if he refuses will touch base with wife about coming up with a new plan from here.

## 2019-01-18 ENCOUNTER — Other Ambulatory Visit: Payer: Self-pay | Admitting: Cardiology

## 2019-01-18 ENCOUNTER — Ambulatory Visit: Payer: 59 | Admitting: Cardiology

## 2019-01-18 ENCOUNTER — Encounter: Payer: Self-pay | Admitting: Family Medicine

## 2019-01-18 ENCOUNTER — Other Ambulatory Visit: Payer: Self-pay

## 2019-01-18 DIAGNOSIS — F332 Major depressive disorder, recurrent severe without psychotic features: Secondary | ICD-10-CM

## 2019-01-18 DIAGNOSIS — R0602 Shortness of breath: Secondary | ICD-10-CM

## 2019-01-18 NOTE — Progress Notes (Deleted)
Primary Physician:  Ria Bush, MD   Patient ID: Adam Patterson, male    DOB: 21-Sep-1960, 58 y.o.   MRN: [id number]  Subjective:    No chief complaint on file.   HPI: Adam Patterson  is a 58 y.o. male  with coronary artery disease, h/o cardiac arrest and inferior wall myocardial infarction on 02/04/2016, has 3 drug-eluting stents placed to the dominant RCA. 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 Medical therapy was recommended. He has history of hypertension, hyperlipidemia and diabetes mellitus.  Last seen in Sept 2019 with dizziness and diarrhea. Started on plain Aldactone. He recently called our office for shortness of breath and now presents for follow up.  He is presently not having any chest pain, he has not gone back to doing his usual activities and has started to enjoy being active. He has not had to use any sublingual nitroglycerin. He states that he has made a lot of lifestyle changes with diet and physical activity.    Past Medical History:  Diagnosis Date  . Abnormal liver enzymes 2006   no prior workup  . Acute MI, inferior wall, initial episode of care ([place]) 02/04/2016   cardiac arrest s/p 3 DES placed to dominant RCA ([place])  . Diabetes mellitus   . Hyperlipidemia   . Hypertension   . Myocardial infarction ([place])   . Obesity   . OSA (obstructive sleep apnea)    doesn't use machine. sleep study 2007  . Psoriasis    affecting hands (skin)    Past Surgical History:  Procedure Laterality Date  . CARDIAC CATHETERIZATION N/A 02/04/2016   Procedure: Left Heart Cath and Coronary Angiography;  Surgeon: Adrian Prows, MD;  Location: [facility];  Service: Cardiovascular;  Laterality: N/A;  . CARDIAC CATHETERIZATION N/A 02/04/2016   3 stents placed ([place])  .  CARDIOVASCULAR STRESS TEST  03/2017   high risk - EF 34%, inferior and apical infarct and akinesis ([place])  . COLONOSCOPY  02/2009  . COLONOSCOPY WITH PROPOFOL N/A 12/18/2017   2 benign polyps, diverticulosis, rpt 10 yrs ([place])  . HERNIA REPAIR  2000  . LEFT HEART CATH AND CORONARY ANGIOGRAPHY N/A 05/12/2017   Procedure: LEFT HEART CATH AND CORONARY ANGIOGRAPHY;  Surgeon: Adrian Prows, MD  . POLYPECTOMY  12/18/2017   Procedure: POLYPECTOMY;  Surgeon: Lucilla Lame, MD  . [place]  . VASECTOMY  1995    Social History   Socioeconomic History  . Marital status: Married    Spouse name: Not on file  . Number of children: Not on file  . Years of education: Not on file  . Highest education level: Not on file  Occupational History  . Not on file  Social Needs  . Financial resource strain: Not on file  . Food insecurity    Worry: Not on file    Inability: Not on file  . Transportation needs    Medical: Not on file    Non-medical: Not on file  Tobacco Use  . Smoking status: Light Tobacco Smoker    Types: Pipe, Cigarettes    Last attempt to quit: 03/30/2010    Years since quitting: 8.8  . Smokeless tobacco: Never Used  Substance and Sexual Activity  . Alcohol use: Yes    Alcohol/week: 5.0 standard drinks  Types: 5 Cans of beer per week  . Drug use: No  . Sexual activity: Never  Lifestyle  . Physical activity    Days per week: Not on file    Minutes per session: Not on file  . Stress: Not on file  Relationships  . Social Herbalist on phone: Not on file    Gets together: Not on file    Attends religious service: Not on file    Active member of club or organization: Not on file    Attends meetings of clubs or organizations: Not on file    Relationship status: Not on file  . Intimate partner violence    Fear of current or ex partner: Not on file    Emotionally abused: Not on file    Physically abused: Not on file    Forced sexual activity: Not on file   Other Topics Concern  . 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  Eyes: Negative for visual disturbance.  Cardiovascular: Negative for chest pain, claudication, dyspnea on exertion, leg swelling, orthopnea, palpitations and syncope.  Respiratory: Negative for hemoptysis and wheezing.   Endocrine: Negative for cold intolerance and heat intolerance.  Hematologic/Lymphatic: Does not bruise/bleed easily.  Skin: Negative for nail changes.  Musculoskeletal: Negative for muscle weakness and myalgias.  Gastrointestinal: Positive for diarrhea (chronic). Negative for abdominal pain, change in bowel habit, nausea and vomiting.  Neurological: Positive for dizziness (with movement of head). Negative for difficulty with concentration, focal weakness and headaches.  Psychiatric/Behavioral: Negative for altered mental status and suicidal ideas.  All other systems reviewed and are negative.     Objective:  There were no vitals taken for this visit. There is no height or weight on file to calculate BMI.    Physical Exam  Constitutional: He is oriented to person, place, and time. Vital signs are normal. He appears well-developed and well-nourished.  Well built and morbidly obese  HENT:  Head: Normocephalic and atraumatic.  Neck: Normal range of motion.  Cardiovascular: Normal rate, regular rhythm, normal heart sounds and intact distal pulses.  Pulses:      Femoral pulses are 1+ on the right side and 1+ on the left side.      Popliteal pulses are 1+ on the right side and 1+ on the left side.       Dorsalis pedis pulses are 2+ on the right side and 2+  on the left side.       Posterior tibial pulses are 2+ on the right side and 2+ on the left side.  Pulses difficult to feel due to bodily habitus  Pulmonary/Chest: Effort normal and breath sounds normal. No accessory muscle usage. No respiratory distress.  Abdominal: Soft. Bowel sounds are normal.  Musculoskeletal: Normal range of motion.  Neurological: He is alert and oriented to person, place, and time.  Skin: Skin is warm and dry.  Vitals reviewed.  Radiology: No results found.  Laboratory examination:   *** CMP Latest Ref Rng & Units 01/19/2018 09/16/2017 05/12/2017  Glucose 70 - 99 mg/dL - 88 -  BUN 6 - 23 mg/dL - 17 -  Creatinine 0.61 - 1.24 mg/dL 1.00 1.02 -  Sodium 135 - 145 mEq/L - 137 -  Potassium 3.5 - 5.1 mEq/L - 3.5 3.2(L)  Chloride 96 - 112 mEq/L - 98 -  CO2 19 - 32 mEq/L - 30 -  Calcium 8.4 - 10.5 mg/dL - 10.2 -  Total Protein 6.0 - 8.3 g/dL - 7.0 -  Total Bilirubin 0.2 - 1.2 mg/dL - 0.7 -  Alkaline Phos 39 - 117 U/L - 39 -  AST 0 - 37 U/L - 25 -  ALT 0 - 53 U/L - 18 -   CBC Latest Ref Rng & Units 03/16/2017 01/30/2017 06/06/2016  WBC 3.8 - 10.6 K/uL 6.8 6.9 10.6  Hemoglobin 13.0 - 18.0 g/dL 13.9 15.1 13.7  Hematocrit 40.0 - 52.0 % 40.0 - 39.8(L)  Platelets 150 - 440 K/uL 210 242 260   Lipid Panel     Component Value Date/Time   CHOL 110 09/16/2017 1832   TRIG 60.0 09/16/2017 1832   HDL 56.90 09/16/2017 1832   CHOLHDL 2 09/16/2017 1832   VLDL 12.0 09/16/2017 1832   LDLCALC 41 09/16/2017 1832   LDLDIRECT 45.0 04/15/2017 1914   HEMOGLOBIN A1C Lab Results  Component Value Date   HGBA1C 10.4 (A) 09/22/2018   MPG 140 02/05/2016   TSH No results for input(s): TSH in the last 8760 hours.  PRN Meds:. There are no discontinued medications. No outpatient medications have been marked as taking for the 01/18/19 encounter (Appointment) with Miquel Dunn, NP.    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 RCA proximal, distal 3 overlapping stents 2.5 x 26, 3.5 x 28 and 3.5 x 18 resolute for acute inferior MI patent with mild restenosis. 60% mid circumflex diffuse and 50% mid LAD stenosis diffuse disease. Normal LVEF.  Echocardiogram 04/09/2017: Left ventricle cavity is normal in size. Mild concentric remodeling of the left ventricle. Normal global wall motion. Visual EF is 55-60%. Normal diastolic filling pattern. Calculated EF 60%. Left atrial cavity is mildly dilated at 4.2 cm. Structurally normal tricuspid valve with no regurgitation noted. Unable to estimate PA pressure. The aortic root size in the upper limit of normal at 3.8 cm.  Exercise sestamibi stress test 03/20/2017: 1. The resting electrocardiogram demonstrated normal sinus rhythm, normal resting conduction and no resting arrhythmias. Inferior and lateral infarct, old. Non specific ST-T change. The stress electrocardiogram was negative for ischemia. Patient exercised on Bruce protocol for 8:00 minutes and achieved 10.11 METS. Stress test terminated due to chest pain, dyspnea, and 86% MPHR achieved (Target HR >85%). Hypertenisve BP esponse with peak BP 212/90 mm Hg. 2. Left ventricular cavity is noted to be enlarged on the rest and stress studies at 178 mL. There is a large area of infarction in the basal inferior, mid inferior, apical inferior and apical myocardial wall(s). Gated SPECT imaging demonstrates akinesis of the basal inferior, mid inferior, apical inferior and apical myocardial wall(s). The left ventricular ejection fraction was calculatedto be 34%. This is a high risk study.  Assessment:   No diagnosis found.  EKG 04/01/2018: Sinus bradycardia at rate of 50 bpm, left atrial enlargement, inferior infarct old. No evidence of ischemia. Nonspecific T abnormality. Compared to EKG 09/24/2017: Marked sinus bradycardia at the rate of 41 bpm.  Recommendations:   ***   Miquel Dunn, MSN, APRN, FNP-C [facility] [place] Office: [phone] Fax: [phone]

## 2019-01-18 NOTE — Progress Notes (Signed)
Orders place for COVID testing

## 2019-01-19 ENCOUNTER — Ambulatory Visit: Payer: Self-pay | Admitting: *Deleted

## 2019-01-19 ENCOUNTER — Telehealth: Payer: Self-pay | Admitting: Family Medicine

## 2019-01-19 NOTE — Telephone Encounter (Signed)
I don't understand why I'm being asked to put an order in.  I asked for patient to be tested 2 weeks ago now, has not happened and I understand because patient has not been returning our calls, but I have not had to put orders in previously for patients tested through the [facility].  Reviewing chart, order was placed yesterday by cardiology office so I don't need to place an order.  Please call [facility] to clarify, and ask them to contact patient to be scheduled for testing.

## 2019-01-19 NOTE — Telephone Encounter (Signed)
I spoke directly with the scheduling agent at the [facility].  We reviewed patient's needs and order and she is aware that patient needs to be called immediately for testing to be scheduled.   She verbalizes understanding and took all information to contact patient.

## 2019-01-19 NOTE — Telephone Encounter (Signed)
Thank you :)

## 2019-01-19 NOTE — Telephone Encounter (Signed)
[facility] called today and stated that the patient was unable to make it an appointment today elsewhere due to having covid symptoms.  Patient is needing to be tested and asked if you could put an order in for the patient to be tested.

## 2019-01-19 NOTE — Telephone Encounter (Signed)
No answer

## 2019-01-20 NOTE — Telephone Encounter (Signed)
No triage intended. Opened encounter to reach out to patient to schedule Covid19 testing.

## 2019-02-28 ENCOUNTER — Encounter: Payer: Self-pay | Admitting: Family Medicine

## 2019-03-03 ENCOUNTER — Encounter: Payer: Self-pay | Admitting: Family Medicine

## 2019-03-03 ENCOUNTER — Telehealth: Payer: Self-pay

## 2019-03-03 NOTE — Telephone Encounter (Signed)
Left detailed VM w COVID screen and back door lab info   

## 2019-03-07 ENCOUNTER — Other Ambulatory Visit (INDEPENDENT_AMBULATORY_CARE_PROVIDER_SITE_OTHER): Payer: 59

## 2019-03-07 ENCOUNTER — Other Ambulatory Visit: Payer: Self-pay | Admitting: Family Medicine

## 2019-03-07 ENCOUNTER — Other Ambulatory Visit: Payer: Self-pay

## 2019-03-07 DIAGNOSIS — E1165 Type 2 diabetes mellitus with hyperglycemia: Secondary | ICD-10-CM | POA: Diagnosis not present

## 2019-03-07 DIAGNOSIS — E785 Hyperlipidemia, unspecified: Secondary | ICD-10-CM | POA: Diagnosis not present

## 2019-03-07 DIAGNOSIS — E118 Type 2 diabetes mellitus with unspecified complications: Secondary | ICD-10-CM | POA: Diagnosis not present

## 2019-03-07 DIAGNOSIS — Z125 Encounter for screening for malignant neoplasm of prostate: Secondary | ICD-10-CM | POA: Diagnosis not present

## 2019-03-07 DIAGNOSIS — IMO0002 Reserved for concepts with insufficient information to code with codable children: Secondary | ICD-10-CM

## 2019-03-07 LAB — COMPREHENSIVE METABOLIC PANEL
ALT: 14 U/L (ref 0–53)
AST: 16 U/L (ref 0–37)
Albumin: 4.3 g/dL (ref 3.5–5.2)
Alkaline Phosphatase: 47 U/L (ref 39–117)
BUN: 13 mg/dL (ref 6–23)
CO2: 32 mEq/L (ref 19–32)
Calcium: 9.3 mg/dL (ref 8.4–10.5)
Chloride: 97 mEq/L (ref 96–112)
Creatinine, Ser: 1.14 mg/dL (ref 0.40–1.50)
GFR: 65.59 mL/min (ref >60.00)
Glucose, Bld: 270 mg/dL — ABNORMAL HIGH (ref 70–99)
Potassium: 3.6 mEq/L (ref 3.5–5.1)
Sodium: 137 mEq/L (ref 135–145)
Total Bilirubin: 0.5 mg/dL (ref 0.2–1.2)
Total Protein: 6.6 g/dL (ref 6.0–8.3)

## 2019-03-07 LAB — LIPID PANEL
Cholesterol: 107 mg/dL (ref 0–200)
HDL: 38.6 mg/dL — ABNORMAL LOW (ref >39.00)
LDL Cholesterol: 45 mg/dL (ref 0–99)
NonHDL: 68.8
Total CHOL/HDL Ratio: 3
Triglycerides: 120 mg/dL (ref 0.0–149.0)
VLDL: 24 mg/dL (ref 0.0–40.0)

## 2019-03-07 LAB — HEMOGLOBIN A1C: Hgb A1c MFr Bld: 11.1 % — ABNORMAL HIGH (ref 4.6–6.5)

## 2019-03-08 ENCOUNTER — Other Ambulatory Visit (INDEPENDENT_AMBULATORY_CARE_PROVIDER_SITE_OTHER): Payer: 59

## 2019-03-08 DIAGNOSIS — R5383 Other fatigue: Secondary | ICD-10-CM

## 2019-03-08 LAB — CBC WITH DIFFERENTIAL/PLATELET
Basophils Absolute: 0.1 10*3/uL (ref 0.0–0.1)
Basophils Relative: 0.8 % (ref 0.0–3.0)
Eosinophils Absolute: 0.3 10*3/uL (ref 0.0–0.7)
Eosinophils Relative: 4.4 % (ref 0.0–5.0)
HCT: 41.6 % (ref 39.0–52.0)
Hemoglobin: 14.1 g/dL (ref 13.0–17.0)
Lymphocytes Relative: 25.4 % (ref 12.0–46.0)
Lymphs Abs: 1.7 10*3/uL (ref 0.7–4.0)
MCHC: 33.9 g/dL (ref 30.0–36.0)
MCV: 86.6 fl (ref 78.0–100.0)
Monocytes Absolute: 0.6 10*3/uL (ref 0.1–1.0)
Monocytes Relative: 8.8 % (ref 3.0–12.0)
Neutro Abs: 4.1 10*3/uL (ref 1.4–7.7)
Neutrophils Relative %: 60.6 % (ref 43.0–77.0)
Platelets: 255 10*3/uL (ref 150.0–400.0)
RBC: 4.8 Mil/uL (ref 4.22–5.81)
RDW: 14.1 % (ref 11.5–15.5)
WBC: 6.7 10*3/uL (ref 4.0–10.5)

## 2019-03-08 LAB — VITAMIN D 25 HYDROXY (VIT D DEFICIENCY, FRACTURES): VITD: 55.49 ng/mL (ref 30.00–100.00)

## 2019-03-08 LAB — PSA: PSA: 3.04 ng/mL (ref 0.10–4.00)

## 2019-03-08 LAB — VITAMIN B12: Vitamin B-12: 361 pg/mL (ref 211–911)

## 2019-03-08 LAB — TESTOSTERONE: Testosterone: 188.9 ng/dL — ABNORMAL LOW (ref 300.00–890.00)

## 2019-03-08 LAB — TSH: TSH: 1.74 u[IU]/mL (ref 0.35–4.50)

## 2019-03-11 ENCOUNTER — Other Ambulatory Visit: Payer: Self-pay | Admitting: Family Medicine

## 2019-03-11 ENCOUNTER — Encounter: Payer: Self-pay | Admitting: Family Medicine

## 2019-03-11 NOTE — Telephone Encounter (Signed)
Request sent to Dr. Darnell Level.

## 2019-03-11 NOTE — Telephone Encounter (Signed)
Citalopram Last rx:  01/06/19 Last OV:  01/06/19, acute and f/u Next OV:  03/25/19, CPE

## 2019-03-12 MED ORDER — CITALOPRAM HYDROBROMIDE 20 MG PO TABS: 20.0000 mg | ORAL_TABLET | Freq: Every day | ORAL | 1 refills | Status: DC

## 2019-03-12 NOTE — Addendum Note (Signed)
Addended by: Ria Bush on: 03/12/2019 09:22 AM   Modules accepted: Orders

## 2019-03-14 ENCOUNTER — Encounter: Payer: 59 | Admitting: Family Medicine

## 2019-03-25 ENCOUNTER — Ambulatory Visit (INDEPENDENT_AMBULATORY_CARE_PROVIDER_SITE_OTHER): Payer: 59 | Admitting: Family Medicine

## 2019-03-25 ENCOUNTER — Encounter: Payer: Self-pay | Admitting: Family Medicine

## 2019-03-25 ENCOUNTER — Other Ambulatory Visit: Payer: Self-pay

## 2019-03-25 DIAGNOSIS — E118 Type 2 diabetes mellitus with unspecified complications: Secondary | ICD-10-CM | POA: Diagnosis not present

## 2019-03-25 DIAGNOSIS — IMO0002 Reserved for concepts with insufficient information to code with codable children: Secondary | ICD-10-CM

## 2019-03-25 DIAGNOSIS — R21 Rash and other nonspecific skin eruption: Secondary | ICD-10-CM | POA: Diagnosis not present

## 2019-03-25 DIAGNOSIS — E785 Hyperlipidemia, unspecified: Secondary | ICD-10-CM

## 2019-03-25 DIAGNOSIS — E538 Deficiency of other specified B group vitamins: Secondary | ICD-10-CM

## 2019-03-25 DIAGNOSIS — E1165 Type 2 diabetes mellitus with hyperglycemia: Secondary | ICD-10-CM

## 2019-03-25 DIAGNOSIS — Z6841 Body Mass Index (BMI) 40.0 and over, adult: Secondary | ICD-10-CM

## 2019-03-25 DIAGNOSIS — Z0001 Encounter for general adult medical examination with abnormal findings: Secondary | ICD-10-CM | POA: Diagnosis not present

## 2019-03-25 DIAGNOSIS — I5022 Chronic systolic (congestive) heart failure: Secondary | ICD-10-CM | POA: Diagnosis not present

## 2019-03-25 DIAGNOSIS — I10 Essential (primary) hypertension: Secondary | ICD-10-CM | POA: Diagnosis not present

## 2019-03-25 DIAGNOSIS — F332 Major depressive disorder, recurrent severe without psychotic features: Secondary | ICD-10-CM | POA: Diagnosis not present

## 2019-03-25 DIAGNOSIS — E291 Testicular hypofunction: Secondary | ICD-10-CM

## 2019-03-25 MED ORDER — LOSARTAN POTASSIUM 100 MG PO TABS: ORAL_TABLET | ORAL | 3 refills | Status: DC

## 2019-03-25 MED ORDER — ROSUVASTATIN CALCIUM 40 MG PO TABS: 40.0000 mg | ORAL_TABLET | Freq: Every day | ORAL | 3 refills | Status: DC

## 2019-03-25 MED ORDER — CARVEDILOL 6.25 MG PO TABS: ORAL_TABLET | ORAL | 3 refills | Status: DC

## 2019-03-25 MED ORDER — AMLODIPINE BESYLATE 10 MG PO TABS: 10.0000 mg | ORAL_TABLET | Freq: Every day | ORAL | 3 refills | Status: DC

## 2019-03-25 MED ORDER — METFORMIN HCL 1000 MG PO TABS: 1000.0000 mg | ORAL_TABLET | Freq: Two times a day (BID) | ORAL | 3 refills | Status: DC

## 2019-03-25 MED ORDER — HYDROCHLOROTHIAZIDE 25 MG PO TABS: ORAL_TABLET | ORAL | 3 refills | Status: DC

## 2019-03-25 MED ORDER — TRULICITY 0.75 MG/0.5ML ~~LOC~~ SOAJ: 0.7500 mg | SUBCUTANEOUS | 3 refills | Status: DC

## 2019-03-25 MED ORDER — VITAMIN B-12 1000 MCG PO TABS: 1000.0000 ug | ORAL_TABLET | Freq: Every day | ORAL | Status: AC

## 2019-03-25 NOTE — Progress Notes (Signed)
This visit was conducted in person.  BP 118/66 (BP Location: Left Arm, Patient Position: Sitting, Cuff Size: Large)   Pulse (!) 54   Temp 97.7 F (36.5 C) (Tympanic)   Ht 5\' 9"  (1.753 m)   Wt 293 lb 3 oz (133 kg)   SpO2 96%   BMI 43.30 kg/m    CC: CPE Subjective:    Patient ID: Adam Patterson, male    DOB: 1960/11/07, 58 y.o.   MRN: [id number]  HPI: Adam Patterson is a 58 y.o. male presenting on 03/25/2019 for Annual Exam   See prior notes for details. Last seen 01/06/2019 - upcoming cardiology appt later this month. Pt was upset with way covid testing protocol was handled last visit, never got tested. Refused to take my phone calls for prolonged period of time. We discussed this, I advised it was unacceptable to do this if we were to have a productive patient-doctor relationship. He agreed to continue this physician/doctor relationship.   DM - last visit had not been checking sugars. Did not pick up test strips. Struggling with motivation.   Recurrent MDD - celexa 20mg  restarted over summer. He had stopped this, but again restarted recently. He had been meeting with Ronnie Derby but feels tele-counseling isn't as effective, has not seen recently. Would be intersted in psychiatry evaluation for medication optimization - if they offer an in-office visit - doesn't want to do virtual visits.   Preventative: COLONOSCOPY WITH PROPOFOL 12/18/2017 - 2 benign polyps, diverticulosis, rpt 10 yrs ([facility]) Prostate cancer screening - discussed, would like to continue screening Flu shot - declined today Pneumovax 2018 Tetanus - unsure shingrix - discussed  Seat belt use discussed  Sunscreen use discussed. No changing moles on skin.  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 Interim medical history since our last visit reviewed. Allergies and medications reviewed and updated. Outpatient Medications Prior to Visit  Medication Sig Dispense Refill  . aspirin 325 MG tablet Take 325 mg by mouth daily.    . Cholecalciferol (VITAMIN D3) 2000 UNITS TABS Take 2,000 Units by mouth 2 (two) times daily.     . citalopram (CELEXA) 20 MG tablet Take 1 tablet (20 mg total) by mouth daily. 90 tablet 1  . fish oil-omega-3 fatty acids 1000 MG capsule Take 1 g by mouth 2 (two) times daily.     Marland Kitchen glucose blood (ACCU-CHEK AVIVA) test strip Use as instructed 100 each 3  . nitroGLYCERIN (NITROSTAT) 0.4 MG SL tablet Place 1 tablet (0.4 mg total) under the tongue every 5 (five) minutes x 3 doses as needed for chest pain. 25 tablet 1  . amLODipine (NORVASC) 10 MG tablet TAKE 1 TABLET (10 MG TOTAL) BY MOUTH DAILY. 90 tablet 1  . carvedilol (COREG) 6.25 MG tablet TAKE 1 TABLET BY MOUTH TWICE DAILY WITH A MEAL. 180 tablet 0  . hydrochlorothiazide (HYDRODIURIL) 25 MG tablet TAKE 1 TABLET BY MOUTH DAILY WITH LOSARTAN 90 tablet 1  . losartan (COZAAR) 100 MG tablet TAKE 1 TABLET BY MOUTH DAILY  WITH HCTZ 90 tablet 0  . Magnesium 250 MG TABS Take 250 mg by mouth 2 (two) times daily.    . metFORMIN (GLUCOPHAGE) 1000 MG tablet TAKE 1 TABLET (1,000 MG TOTAL) BY MOUTH 2 TIMES DAILY. 180 tablet 1  . rosuvastatin (CRESTOR) 40 MG tablet TAKE 1 TABLET (40 MG TOTAL) BY MOUTH DAILY. 90 tablet 1  . vitamin B-12 (CYANOCOBALAMIN) 1000 MCG tablet Take 1,000 mcg by mouth 2 (two) times daily.    Marland Kitchen spironolactone (ALDACTONE) 25 MG tablet Take 1 tablet (25 mg total) by mouth every morning. 30 tablet 3   No facility-administered medications prior to visit.      Per HPI unless specifically  indicated in ROS section below Review of Systems  Constitutional: Negative for activity change, appetite change, chills, fatigue, fever and unexpected weight change.  HENT: Positive for congestion. Negative for hearing loss.   Eyes: Negative for visual disturbance.  Respiratory: Negative for cough, chest tightness, shortness of breath and wheezing.   Cardiovascular: Negative for chest pain, palpitations and leg swelling.  Gastrointestinal: Negative for abdominal distention, abdominal pain, blood in stool, constipation, diarrhea, nausea and vomiting.  Genitourinary: Negative for difficulty urinating and hematuria.  Musculoskeletal: Negative for arthralgias, myalgias and neck pain.  Skin: Negative for rash.  Neurological: Negative for dizziness, seizures, syncope and headaches.  Hematological: Negative for adenopathy. Does not bruise/bleed easily.  Psychiatric/Behavioral: Positive for dysphoric mood. The patient is nervous/anxious.    Objective:    BP 118/66 (BP Location: Left Arm, Patient Position: Sitting, Cuff Size: Large)   Pulse (!) 54   Temp 97.7 F (36.5 C) (Tympanic)   Ht 5\' 9"  (1.753 m)   Wt 293 lb 3 oz (133 kg)   SpO2 96%   BMI 43.30 kg/m   Wt Readings from Last 3 Encounters:  03/25/19 293 lb 3 oz (133 kg)  01/06/19 290 lb (131.5 kg)  10/26/18 283 lb (128.4 kg)    Physical Exam Vitals signs and nursing note reviewed.  Constitutional:      General: He is not in acute distress.    Appearance: Normal appearance. He is well-developed. He is not ill-appearing.  HENT:     Head: Normocephalic and atraumatic.     Right Ear: Hearing, tympanic membrane, ear canal and external ear normal.     Left Ear: Hearing, tympanic membrane, ear canal and external ear normal.     Nose: Nose normal.     Mouth/Throat:     Mouth: Mucous membranes are moist.     Pharynx: Uvula midline. No oropharyngeal exudate or posterior oropharyngeal erythema.  Eyes:     General: No scleral icterus.     Extraocular Movements: Extraocular movements intact.     Conjunctiva/sclera: Conjunctivae normal.     Pupils: Pupils are equal, round, and reactive to light.  Neck:     Musculoskeletal: Normal range of motion and neck supple.     Vascular: No carotid bruit.  Cardiovascular:     Rate and Rhythm: Normal rate and regular rhythm.     Pulses: Normal pulses.          Radial pulses are 2+ on the right side and 2+ on the left side.     Heart sounds: Normal heart sounds. No murmur.  Pulmonary:     Effort: Pulmonary effort is normal. No respiratory distress.     Breath sounds: Normal breath sounds. No wheezing, rhonchi or rales.  Abdominal:     General: Abdomen is flat. Bowel  sounds are normal. There is no distension.     Palpations: Abdomen is soft. There is no mass.     Tenderness: There is no abdominal tenderness. There is no guarding or rebound.     Hernia: No hernia is present.  Genitourinary:    Prostate: Normal. Not enlarged, not tender and no nodules present.     Rectum: Normal. No mass, tenderness, anal fissure, external hemorrhoid or internal hemorrhoid. Normal anal tone.  Musculoskeletal: Normal range of motion.     Right lower leg: No edema.     Left lower leg: No edema.  Lymphadenopathy:     Cervical: No cervical adenopathy.  Skin:    General: Skin is warm and dry.     Findings: Rash present.     Comments: Peeling rash bilateral palms  Neurological:     General: No focal deficit present.     Mental Status: He is alert and oriented to person, place, and time.     Comments: CN grossly intact, station and gait intact  Psychiatric:        Mood and Affect: Mood normal.        Behavior: Behavior normal.        Thought Content: Thought content normal.        Judgment: Judgment normal.       Results for orders placed or performed in visit on 03/08/19  TSH  Result Value Ref Range   TSH 1.74 0.35 - 4.50 uIU/mL  CBC with Differential  Result Value Ref Range   WBC 6.7 4.0 -  10.5 K/uL   RBC 4.80 4.22 - 5.81 Mil/uL   Hemoglobin 14.1 13.0 - 17.0 g/dL   HCT 41.6 39.0 - 52.0 %   MCV 86.6 78.0 - 100.0 fl   MCHC 33.9 30.0 - 36.0 g/dL   RDW 14.1 11.5 - 15.5 %   Platelets 255.0 150.0 - 400.0 K/uL   Neutrophils Relative % 60.6 43.0 - 77.0 %   Lymphocytes Relative 25.4 12.0 - 46.0 %   Monocytes Relative 8.8 3.0 - 12.0 %   Eosinophils Relative 4.4 0.0 - 5.0 %   Basophils Relative 0.8 0.0 - 3.0 %   Neutro Abs 4.1 1.4 - 7.7 K/uL   Lymphs Abs 1.7 0.7 - 4.0 K/uL   Monocytes Absolute 0.6 0.1 - 1.0 K/uL   Eosinophils Absolute 0.3 0.0 - 0.7 K/uL   Basophils Absolute 0.1 0.0 - 0.1 K/uL  Vitamin B12  Result Value Ref Range   Vitamin B-12 361 211 - 911 pg/mL  Vitamin D 25 hydroxy  Result Value Ref Range   VITD 55.49 30.00 - 100.00 ng/mL  Testosterone  Result Value Ref Range   Testosterone 188.90 (L) 300.00 - 890.00 ng/dL   Depression screen [facility] 2/9 03/25/2019 10/26/2018 09/22/2018 09/16/2017 02/13/2016  Decreased Interest 3 2 3  0 0  Down, Depressed, Hopeless 3 1 3  0 1  PHQ - 2 Score 6 3 6  0 1  Altered sleeping 3 3 3  - 0  Tired, decreased energy 3 3 3  - 0  Change in appetite 3 0 3 - -  Feeling bad or failure about yourself  3 2 3  - 3  Trouble concentrating 3 1 3  - 0  Moving slowly or fidgety/restless 0 0 3 - 0  Suicidal thoughts 1 2 3  - 0  PHQ-9 Score 22 14 27  - 4    GAD 7 : Generalized Anxiety Score 03/25/2019 09/22/2018  Nervous, Anxious, on Edge 3 3  Control/stop worrying 3 3  Worry too much - different things 3 3  Trouble relaxing 3 3  Restless 1 0  Easily annoyed or irritable 3 3  Afraid - awful might happen 3 3  Total GAD 7 Score 19 18   Assessment & Plan:   Problem List Items Addressed This Visit    Skin rash    Chronic, longstanding. ?dyshidrotic eczema vs psoriasis. In uncontrolled diabetic, will need KOH skin test to r/o tinea. Will check next visit. In interim rec start OTC lotrimin BID treatment.       MDD (major depressive disorder), recurrent  episode, severe (HCC)    Severe, ongoing, exacerbated by covid pandemic with some paranoid ideations. Recently restarted celexa 20mg  daily which I think is beneficial. No longer seeing counselor. Pt interested in psychiatric eval which I think he would benefit from - but mentions he is only interested if able to see in-person. I will check to see if psych offices are offering this.  Also asks about evaluation for short term disability due to mental health and cardiac conditions - I will touch base with my care coordinator on this.       Low serum vitamin B12    rec restart oral replacement in metformin use.       Hypogonadism in male    Anticipate multifactorial, with weight contributing. Discussed reasons to treat hypogonadism including mood improvement, energy levels and bone health. He is currently not interested in pursuing treatment.       Hypertension    Chronic, stable. Continue current regimen.       Relevant Medications   amLODipine (NORVASC) 10 MG tablet   carvedilol (COREG) 6.25 MG tablet   hydrochlorothiazide (HYDRODIURIL) 25 MG tablet   losartan (COZAAR) 100 MG tablet   rosuvastatin (CRESTOR) 40 MG tablet   Hyperlipidemia    Chronic - continue crestor and fish oil. The ASCVD Risk score Mikey Bussing DC Jr., et al., 2013) failed to calculate for the following reasons:   The valid total cholesterol range is 130 to 320 mg/dL       Relevant Medications   amLODipine (NORVASC) 10 MG tablet   carvedilol (COREG) 6.25 MG tablet   hydrochlorothiazide (HYDRODIURIL) 25 MG tablet   losartan (COZAAR) 100 MG tablet   rosuvastatin (CRESTOR) 40 MG tablet   HFrEF (heart failure with reduced ejection fraction) ([place])    Upcoming cards f/u appt.       Relevant Medications   amLODipine (NORVASC) 10 MG tablet   carvedilol (COREG) 6.25 MG tablet   hydrochlorothiazide (HYDRODIURIL) 25 MG tablet   losartan (COZAAR) 100 MG tablet   rosuvastatin (CRESTOR) 40 MG tablet   Encounter for general  adult medical examination with abnormal findings    Preventative protocols reviewed and updated unless pt declined. Discussed healthy diet and lifestyle.       Diabetes mellitus type 2, uncontrolled, with complications (HCC)    Chronic, deteriorated as evidenced by high A1c. Has not been checking sugars. Currently only on full dose metformin. Did not price out weekly GLP1 RA. Will send in again (trulicity) and encouraged he start medication. Reviewed side effects to watch for. No personal or fmhx C-cell thyroid cancer. He will pick up test strips at pharmacy. All meds refilled. Close 30mo DM f/u.       Relevant Medications   losartan (COZAAR) 100 MG tablet   metFORMIN (GLUCOPHAGE) 1000 MG tablet   rosuvastatin (CRESTOR) 40 MG tablet   Dulaglutide (TRULICITY)  0.75 MG/0.5ML SOPN   Body mass index (BMI) of 40.0-44.9 in adult [facility])    Encouraged start incorporating regular exercise into routine.       Relevant Medications   metFORMIN (GLUCOPHAGE) 1000 MG tablet   Dulaglutide (TRULICITY) [id number] [id number] SOPN       Meds ordered this encounter  Medications  . amLODipine (NORVASC) 10 MG tablet    Sig: Take 1 tablet (10 mg total) by mouth daily.    Dispense:  90 tablet    Refill:  3  . carvedilol (COREG) 6.25 MG tablet    Sig: TAKE 1 TABLET BY MOUTH TWICE DAILY WITH A MEAL.    Dispense:  180 tablet    Refill:  3  . hydrochlorothiazide (HYDRODIURIL) 25 MG tablet    Sig: TAKE 1 TABLET BY MOUTH DAILY WITH LOSARTAN    Dispense:  90 tablet    Refill:  3  . losartan (COZAAR) 100 MG tablet    Sig: TAKE 1 TABLET BY MOUTH DAILY WITH HCTZ    Dispense:  90 tablet    Refill:  3  . metFORMIN (GLUCOPHAGE) 1000 MG tablet    Sig: Take 1 tablet (1,000 mg total) by mouth 2 (two) times daily with a meal.    Dispense:  180 tablet    Refill:  3  . rosuvastatin (CRESTOR) 40 MG tablet    Sig: Take 1 tablet (40 mg total) by mouth daily.    Dispense:  90 tablet    Refill:  3  . vitamin B-12  (CYANOCOBALAMIN) 1000 MCG tablet    Sig: Take 1 tablet (1,000 mcg total) by mouth daily.  . Dulaglutide (TRULICITY) [id number] SOPN    Sig: Inject 0.75 mg into the skin once a week.    Dispense:  4 pen    Refill:  3   No orders of the defined types were placed in this encounter.   Patient instructions: Trulicity sent to pharmacy once weekly shot - let me know if unaffordable.  Start checking sugars - you should have strips available at pharmacy  Return in 3 months for diabetes follow up I will check on psychiatry referral. Continue celexa 20mg  for now.  I will check into possible short term disability.   Follow up plan: Return in about 3 months (around 06/24/2019) for follow up visit.  Ria Bush, MD

## 2019-03-25 NOTE — Assessment & Plan Note (Signed)
Preventative protocols reviewed and updated unless pt declined. Discussed healthy diet and lifestyle.  

## 2019-03-25 NOTE — Patient Instructions (Addendum)
Trulicity sent to pharmacy once weekly shot - let me know if unaffordable.  Start checking sugars - you should have strips available at pharmacy  Return in 3 months for diabetes follow up I will check on psychiatry referral. Continue celexa 20mg  for now.  I will check into possible short term disability.   Health Maintenance, Male Adopting a healthy lifestyle and getting preventive care are important in promoting health and wellness. Ask your health care provider about:  The right schedule for you to have regular tests and exams.  Things you can do on your own to prevent diseases and keep yourself healthy. What should I know about diet, weight, and exercise? Eat a healthy diet   Eat a diet that includes plenty of vegetables, fruits, low-fat dairy products, and lean protein.  Do not eat a lot of foods that are high in solid fats, added sugars, or sodium. Maintain a healthy weight Body mass index (BMI) is a measurement that can be used to identify possible weight problems. It estimates body fat based on height and weight. Your health care provider can help determine your BMI and help you achieve or maintain a healthy weight. Get regular exercise Get regular exercise. This is one of the most important things you can do for your health. Most adults should:  Exercise for at least 150 minutes each week. The exercise should increase your heart rate and make you sweat (moderate-intensity exercise).  Do strengthening exercises at least twice a week. This is in addition to the moderate-intensity exercise.  Spend less time sitting. Even light physical activity can be beneficial. Watch cholesterol and blood lipids Have your blood tested for lipids and cholesterol at 60 years of age, then have this test every 5 years. You may need to have your cholesterol levels checked more often if:  Your lipid or cholesterol levels are high.  You are older than 60 years of age.  You are at high risk for  heart disease. What should I know about cancer screening? Many types of cancers can be detected early and may often be prevented. Depending on your health history and family history, you may need to have cancer screening at various ages. This may include screening for:  Colorectal cancer.  Prostate cancer.  Skin cancer.  Lung cancer. What should I know about heart disease, diabetes, and high blood pressure? Blood pressure and heart disease  High blood pressure causes heart disease and increases the risk of stroke. This is more likely to develop in people who have high blood pressure readings, are of African descent, or are overweight.  Talk with your health care provider about your target blood pressure readings.  Have your blood pressure checked: ? Every 3-5 years if you are 29-43 years of age. ? Every year if you are 69 years old or older.  If you are between the ages of 96 and 18 and are a current or former smoker, ask your health care provider if you should have a one-time screening for abdominal aortic aneurysm (AAA). Diabetes Have regular diabetes screenings. This checks your fasting blood sugar level. Have the screening done:  Once every three years after age 25 if you are at a normal weight and have a low risk for diabetes.  More often and at a younger age if you are overweight or have a high risk for diabetes. What should I know about preventing infection? Hepatitis B If you have a higher risk for hepatitis B, you should be  screened for this virus. Talk with your health care provider to find out if you are at risk for hepatitis B infection. Hepatitis C Blood testing is recommended for:  Everyone born from [birth year] through 1967.  Anyone with known risk factors for hepatitis C. Sexually transmitted infections (STIs)  You should be screened each year for STIs, including gonorrhea and chlamydia, if: ? You are sexually active and are younger than 60 years of age. ? You are  older than 60 years of age and your health care provider tells you that you are at risk for this type of infection. ? Your sexual activity has changed since you were last screened, and you are at increased risk for chlamydia or gonorrhea. Ask your health care provider if you are at risk.  Ask your health care provider about whether you are at high risk for HIV. Your health care provider may recommend a prescription medicine to help prevent HIV infection. If you choose to take medicine to prevent HIV, you should first get tested for HIV. You should then be tested every 3 months for as long as you are taking the medicine. Follow these instructions at home: Lifestyle  Do not use any products that contain nicotine or tobacco, such as cigarettes, e-cigarettes, and chewing tobacco. If you need help quitting, ask your health care provider.  Do not use street drugs.  Do not share needles.  Ask your health care provider for help if you need support or information about quitting drugs. Alcohol use  Do not drink alcohol if your health care provider tells you not to drink.  If you drink alcohol: ? Limit how much you have to 0-2 drinks a day. ? Be aware of how much alcohol is in your drink. In the [place] one drink equals one 12 oz bottle of beer (355 mL), one 5 oz glass of wine (148 mL), or one 1 oz glass of hard liquor (44 mL). General instructions  Schedule regular health, dental, and eye exams.  Stay current with your vaccines.  Tell your health care provider if: ? You often feel depressed. ? You have ever been abused or do not feel safe at home. Summary  Adopting a healthy lifestyle and getting preventive care are important in promoting health and wellness.  Follow your health care provider's instructions about healthy diet, exercising, and getting tested or screened for diseases.  Follow your health care provider's instructions on monitoring your cholesterol and blood pressure. This  information is not intended to replace advice given to you by your health care provider. Make sure you discuss any questions you have with your health care provider. Document Released: 12/27/2007 Document Revised: 06/23/2018 Document Reviewed: 06/23/2018 Elsevier Patient Education  2020 [employer].

## 2019-03-26 ENCOUNTER — Encounter: Payer: Self-pay | Admitting: Family Medicine

## 2019-03-26 DIAGNOSIS — R21 Rash and other nonspecific skin eruption: Secondary | ICD-10-CM | POA: Insufficient documentation

## 2019-03-26 DIAGNOSIS — E538 Deficiency of other specified B group vitamins: Secondary | ICD-10-CM | POA: Insufficient documentation

## 2019-03-26 DIAGNOSIS — E291 Testicular hypofunction: Secondary | ICD-10-CM | POA: Insufficient documentation

## 2019-03-26 NOTE — Assessment & Plan Note (Signed)
Anticipate multifactorial, with weight contributing. Discussed reasons to treat hypogonadism including mood improvement, energy levels and bone health. He is currently not interested in pursuing treatment.

## 2019-03-26 NOTE — Assessment & Plan Note (Signed)
Encouraged start incorporating regular exercise into routine.

## 2019-03-26 NOTE — Assessment & Plan Note (Signed)
Upcoming cards f/u appt.

## 2019-03-26 NOTE — Assessment & Plan Note (Signed)
Chronic, stable. Continue current regimen. 

## 2019-03-26 NOTE — Assessment & Plan Note (Signed)
Chronic, longstanding. ?dyshidrotic eczema vs psoriasis. In uncontrolled diabetic, will need KOH skin test to r/o tinea. Will check next visit. In interim rec start OTC lotrimin BID treatment.

## 2019-03-26 NOTE — Assessment & Plan Note (Addendum)
Chronic, deteriorated as evidenced by high A1c. Has not been checking sugars. Currently only on full dose metformin. Did not price out weekly GLP1 RA. Will send in again (trulicity) and encouraged he start medication. Reviewed side effects to watch for. No personal or fmhx C-cell thyroid cancer. He will pick up test strips at pharmacy. All meds refilled. Close 51mo DM f/u.

## 2019-03-26 NOTE — Assessment & Plan Note (Addendum)
Severe, ongoing, exacerbated by covid pandemic with some paranoid ideations. Recently restarted celexa 20mg  daily which I think is beneficial. No longer seeing counselor. Pt interested in psychiatric eval which I think he would benefit from - but mentions he is only interested if able to see in-person. I will check to see if psych offices are offering this.  Also asks about evaluation for short term disability due to mental health and cardiac conditions - I will touch base with my care coordinator on this.

## 2019-03-26 NOTE — Assessment & Plan Note (Signed)
Chronic - continue crestor and fish oil. The ASCVD Risk score Mikey Bussing DC Jr., et al., 2013) failed to calculate for the following reasons:   The valid total cholesterol range is 130 to 320 mg/dL

## 2019-03-26 NOTE — Assessment & Plan Note (Addendum)
rec restart oral replacement in metformin use.

## 2019-04-03 ENCOUNTER — Encounter: Payer: Self-pay | Admitting: Family Medicine

## 2019-04-04 ENCOUNTER — Other Ambulatory Visit: Payer: Self-pay

## 2019-04-04 ENCOUNTER — Ambulatory Visit: Payer: 59 | Admitting: Cardiology

## 2019-04-04 ENCOUNTER — Encounter: Payer: Self-pay | Admitting: Cardiology

## 2019-04-04 VITALS — BP 137/81 | HR 53 | Ht 69.0 in | Wt 295.0 lb

## 2019-04-04 DIAGNOSIS — E7800 Pure hypercholesterolemia, unspecified: Secondary | ICD-10-CM | POA: Diagnosis not present

## 2019-04-04 DIAGNOSIS — I2510 Atherosclerotic heart disease of native coronary artery without angina pectoris: Secondary | ICD-10-CM | POA: Diagnosis not present

## 2019-04-04 DIAGNOSIS — I10 Essential (primary) hypertension: Secondary | ICD-10-CM

## 2019-04-04 DIAGNOSIS — Z6841 Body Mass Index (BMI) 40.0 and over, adult: Secondary | ICD-10-CM | POA: Diagnosis not present

## 2019-04-04 DIAGNOSIS — Q245 Malformation of coronary vessels: Secondary | ICD-10-CM

## 2019-04-04 NOTE — Progress Notes (Signed)
Primary Physician/Referring:  Ria Bush, MD  Patient ID: Adam Patterson, male    DOB: Dec 18, 1960, 58 y.o.   MRN: [id number]  Chief Complaint  Patient presents with   Coronary Artery Disease   Hypertension   Follow-up   HPI:    Adam Patterson  58 y.o. Caucasian male with h/o cardiac arrest and inferior wall myocardial infarction on 02/04/2016, has 3 drug-eluting stents placed to the dominant RCA.   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 Medical therapy was recommended. He has history of hypertension, hyperlipidemia and uncontrolled diabetes mellitus. Since COVID-19, he has not been driving [place], hence says he is depressed and has been eating without any restrictions, gained weight and DM is controlled. Recently started on Trulicity.    Past Medical History:  Diagnosis Date   Abnormal liver enzymes 2006   no prior workup   Acute MI, inferior wall, initial episode of care ([place]) 02/04/2016   cardiac arrest s/p 3 DES placed to dominant RCA ([place])   Diabetes mellitus    Hyperlipidemia    Hypertension    Myocardial infarction ([place])    Obesity    OSA (obstructive sleep apnea)    doesn't use machine. sleep study 2007   Psoriasis    affecting hands (skin)   Past Surgical History:  Procedure Laterality Date   CARDIAC CATHETERIZATION N/A 02/04/2016   Procedure: Left Heart Cath and Coronary Angiography;  Surgeon: Adrian Prows, MD;  Location: [facility];  Service: Cardiovascular;  Laterality: N/A;   CARDIAC CATHETERIZATION N/A 02/04/2016   3 stents placed ([place])   CARDIOVASCULAR STRESS TEST  03/2017   high risk - EF 34%, inferior and apical infarct and akinesis ([place] [place])   COLONOSCOPY  02/2009   COLONOSCOPY WITH PROPOFOL N/A 12/18/2017   2 benign polyps,  diverticulosis, rpt 10 yrs ([place])   HERNIA REPAIR  2000   LEFT HEART CATH AND CORONARY ANGIOGRAPHY N/A 05/12/2017   Procedure: LEFT HEART CATH AND CORONARY ANGIOGRAPHY;  Surgeon: Adrian Prows, MD   POLYPECTOMY  12/18/2017   Procedure: POLYPECTOMY;  Surgeon: Lucilla Lame, MD   TONSILLECTOMY  1975   VASECTOMY  1995   Social History   Socioeconomic History   Marital status: Married    Spouse name: Not on file   Number of children: 3   Years of education: Not on file   Highest education level: Not on file  Occupational History   Not on file  Social Needs   Financial resource strain: Not on file   Food insecurity    Worry: Not on file    Inability: Not on file   Transportation needs    Medical: Not on file    Non-medical: Not on file  Tobacco Use   Smoking status: Light Tobacco Smoker    Types: Pipe, Cigarettes, Cigars    Last attempt to quit: 03/30/2010    Years since quitting: 9.0   Smokeless tobacco: Never Used   Tobacco comment: rarely smokes a cigar  Substance and Sexual Activity   Alcohol use: Yes    Alcohol/week: 5.0 standard drinks    Types: 5 Cans of beer per week   Drug use: No   Sexual activity: Never  Lifestyle   Physical activity    Days per week: Not on file  Minutes per session: Not on file   Stress: Not on file  Relationships   Social connections    Talks on phone: Not on file    Gets together: Not on file    Attends religious service: Not on file    Active member of club or organization: Not on file    Attends meetings of clubs or organizations: Not on file    Relationship status: Not on file   Intimate partner violence    Fear of current or ex partner: Not on file    Emotionally abused: Not on file    Physically abused: Not on file    Forced sexual activity: Not on file  Other Topics Concern   Not on file  Social History Narrative   Lives with wife Clarene Critchley)    [place] children    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 Negative for chills and decreased appetite.  Cardiovascular: Positive for dyspnea on exertion. Negative for leg swelling and syncope.  Endocrine: Negative for cold intolerance.  Hematologic/Lymphatic: Does not bruise/bleed easily.  Musculoskeletal: Negative for joint swelling.  Gastrointestinal: Negative for abdominal pain, anorexia, change in bowel habit, hematochezia and melena.  Neurological: Negative for headaches and light-headedness.  Psychiatric/Behavioral: Positive for depression. Negative for substance abuse.  All other systems reviewed and are negative.  Objective   Vitals with BMI 04/04/2019 03/25/2019 03/25/2019  Height 5\' 9"  - 5\' 9"   Weight 295 lbs - 293 lbs 3 oz  BMI 0000000 - 0000000  Systolic 0000000 - 123456  Diastolic 81 - 66  Pulse 53 54 52    Blood pressure 137/81, pulse (!) 53, height 5\' 9"  (1.753 m), weight 295 lb (133.8 kg), SpO2 98 %. Body mass index is 43.56 kg/m.   Physical Exam  Constitutional:  Moderately built and morbidly obese, no acute distress  HENT:  Head: Atraumatic.  Eyes: Conjunctivae are normal.  Neck: Neck supple. No JVD present. No thyromegaly present.  Cardiovascular: Normal rate, regular rhythm, normal heart sounds and intact distal pulses. Exam reveals no gallop.  No murmur heard. Distal pulse including popliteal artery pulse normal, femoral difficult to feel. No edema. No JVD.   Pulmonary/Chest: Effort normal and breath sounds normal.  Abdominal: Soft. Bowel sounds are normal.  Large pannus present  Musculoskeletal: Normal range of motion.  Neurological: He is alert.  Skin: Skin is warm and dry.  Psychiatric: He has a normal mood and affect.   Radiology: No results found.  Laboratory examination:    Recent Labs    03/07/19 0804  NA 137  K 3.6  CL 97  CO2 32  GLUCOSE 270*  BUN 13  CREATININE 1.14  CALCIUM 9.3   CMP Latest Ref Rng & Units 03/07/2019 01/19/2018 09/16/2017  Glucose 70 - 99 mg/dL 270(H) - 88  BUN 6 - 23 mg/dL 13 - 17  Creatinine 0.40 - 1.50 mg/dL 1.14 1.00 1.02  Sodium 135 - 145 mEq/L 137 - 137  Potassium 3.5 - 5.1 mEq/L 3.6 - 3.5  Chloride 96 - 112 mEq/L 97 - 98  CO2 19 - 32 mEq/L 32 - 30  Calcium 8.4 - 10.5 mg/dL 9.3 - 10.2  Total Protein 6.0 - 8.3 g/dL 6.6 - 7.0  Total Bilirubin 0.2 - 1.2 mg/dL 0.5 - 0.7  Alkaline Phos 39 - 117 U/L 47 - 39  AST 0 - 37 U/L 16 - 25  ALT 0 - 53 U/L 14 - 18   CBC Latest Ref Rng & Units 03/08/2019 03/16/2017 01/30/2017  WBC 4.0 - 10.5 K/uL 6.7 6.8 6.9  Hemoglobin 13.0 - 17.0 g/dL 14.1 13.9 15.1  Hematocrit 39.0 - 52.0 % 41.6 40.0 -  Platelets 150.0 - 400.0 K/uL 255.0 210 242   Lipid Panel     Component Value Date/Time   CHOL 107 03/07/2019 0804   TRIG 120.0 03/07/2019 0804   HDL 38.60 (L) 03/07/2019 0804   CHOLHDL 3 03/07/2019 0804   VLDL 24.0 03/07/2019 0804   LDLCALC 45 03/07/2019 0804   LDLDIRECT 45.0 04/15/2017 1914   HEMOGLOBIN A1C Lab Results  Component Value Date   HGBA1C 11.1 (H) 03/07/2019   MPG 140 02/05/2016   TSH Recent Labs    03/08/19 1435  TSH 1.74   Medications   Allergies  Allergen Reactions   Pravachol [Pravastatin Sodium] Other (See Comments)     Prior to Admission medications   Medication Sig Start Date End Date Taking? Authorizing Provider  amLODipine (NORVASC) 10 MG tablet Take 1 tablet (10 mg total) by mouth daily. 03/25/19  Yes Ria Bush, MD  aspirin 325 MG tablet Take 325 mg by mouth daily.   Yes [provider]  carvedilol (COREG) 6.25 MG tablet TAKE 1 TABLET BY MOUTH TWICE DAILY WITH A MEAL. 03/25/19  Yes Ria Bush, MD  Cholecalciferol (VITAMIN D3) 2000 UNITS TABS Take 2,000 Units by mouth 2 (two) times daily.    Yes [provider]  citalopram  (CELEXA) 20 MG tablet Take 1 tablet (20 mg total) by mouth daily. 03/12/19  Yes Ria Bush, MD  Dulaglutide (TRULICITY) A999333 0000000 SOPN Inject 0.75 mg into the skin once a week. 03/25/19  Yes Ria Bush, MD  fish oil-omega-3 fatty acids 1000 MG capsule Take 1 g by mouth 2 (two) times daily.    Yes [provider]  glucose blood (ACCU-CHEK AVIVA) test strip Use as instructed 01/06/19  Yes Ria Bush, MD  hydrochlorothiazide (HYDRODIURIL) 25 MG tablet TAKE 1 TABLET BY MOUTH DAILY WITH LOSARTAN 03/25/19  Yes Ria Bush, MD  losartan (COZAAR) 100 MG tablet TAKE 1 TABLET BY MOUTH DAILY WITH HCTZ 03/25/19  Yes Ria Bush, MD  metFORMIN (GLUCOPHAGE) 1000 MG tablet Take 1 tablet (1,000 mg total) by mouth 2 (two) times daily with a meal. 03/25/19  Yes Ria Bush, MD  nitroGLYCERIN (NITROSTAT) 0.4 MG SL tablet Place 1 tablet (0.4 mg total) under the tongue every 5 (five) minutes x 3 doses as needed for chest pain. 09/16/17  Yes Ria Bush, MD  rosuvastatin (CRESTOR) 40 MG tablet Take 1 tablet (40 mg total) by mouth daily. 03/25/19  Yes Ria Bush, MD  vitamin B-12 (CYANOCOBALAMIN) 1000 MCG tablet Take 1 tablet (1,000 mcg total) by mouth daily. 03/25/19  Yes Ria Bush, MD     Current Outpatient Medications  Medication Instructions   amLODipine (NORVASC) 10 mg, Oral, Daily   aspirin 325 mg, Oral, Daily   carvedilol (COREG) 6.25 MG tablet TAKE 1 TABLET BY MOUTH TWICE DAILY WITH A MEAL.   citalopram (CELEXA) 20 mg, Oral, Daily   fish oil-omega-3 fatty acids 1 g, Oral, 2 times daily   glucose blood (ACCU-CHEK AVIVA) test strip Use as instructed   hydrochlorothiazide (HYDRODIURIL) 25 MG tablet TAKE 1  TABLET BY MOUTH DAILY WITH LOSARTAN   losartan (COZAAR) 100 MG tablet TAKE 1 TABLET BY MOUTH DAILY WITH HCTZ   metFORMIN (GLUCOPHAGE) 1,000 mg, Oral, 2 times daily with meals   nitroGLYCERIN (NITROSTAT) 0.4 mg, Sublingual, Every 5 min x3  PRN   rosuvastatin (CRESTOR) 40 mg, Oral, Daily   Trulicity A999333 mg, Subcutaneous, Weekly   vitamin B-12 (CYANOCOBALAMIN) 1,000 mcg, Oral, Daily   Vitamin D3 2,000 Units, Oral, 2 times daily    Cardiac Studies:   Echocardiogram [04/09/2017]: Left ventricle cavity is normal in size. Mild concentric remodeling of the left ventricle. Normal global wall motion. Visual EF is 55-60%. Normal diastolic filling pattern. Calculated EF 60%. Left atrial cavity is mildly dilated at 4.2 cm. Structurally normal tricuspid valve with no regurgitation noted. Unable to estimate PA pressure. The aortic root size in the upper limit of normal at 3.8 cm.  Nuclear stress test [03/20/2017]: 1. The resting electrocardiogram demonstrated normal sinus rhythm, normal resting conduction and no resting arrhythmias. Inferior and lateral infarct, old. Non specific ST-T change. The stress electrocardiogram was negative for ischemia. Patient exercised on Bruce protocol for 8:00 minutes and achieved 10.11 METS. Stress test terminated due to chest pain, dyspnea, and 86% MPHR achieved (Target HR >85%). Hypertenisve BP esponse with peak BP 212/90 mm Hg. 2. Left ventricular cavity is noted to be enlarged on the rest and stress studies at 178 mL. There is a large area of infarction in the basal inferior, mid inferior, apical inferior and apical myocardial wall(s). Gated SPECT imaging demonstrates akinesis of the basal inferior, mid inferior, apical inferior and apical myocardial wall(s). The left ventricular ejection fraction was calculatedto be 34%. This is a high risk study.  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 RCA proximal, distal 3 overlapping stents 2.5 x 26, 3.5 x 28 and 3.5 x 18 resolute for acute inferior MI patent with mild restenosis. 60% mid circumflex diffuse and 50% mid LAD stenosis diffuse disease. Normal LVEF.  Assessment      ICD-10-CM   1. 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   3. Essential hypertension  I10   4. Class 3 severe obesity due to excess calories with serious comorbidity and body mass index (BMI) of 40.0 to 44.9 in adult (HCC)  E66.01    Z68.41   5. Hypercholesteremia  E78.00     EKG 04/04/2019: Normal sinus rhythm/sinus bradycardia at rate of 51 bpm, inferior infarct old.  Incomplete right bundle branch block.  Poor R-wave progression, cannot exclude anteroseptal infarct old, pulmonary disease pattern.  Nonspecific T abnormality. No significant change from  EKG 04/01/2018. No change in bradycardia.    Recommendations:   Patient is here on annual visit and follow-up of coronary artery disease, except for depression that he is going through due to Covid 19, he has not had any recurrence of angina pectoris, blood pressure is well controlled, lipids are also under excellent control.  His major issue has been obesity and also uncontrolled diabetes mellitus.  Advised the patient to keep a good spirits, he had missed taking Celexa and he is now back on it, hopefully this will also help.  No changes in the medications were done today.  I'd like to see him back in 6 months for follow-up of CAD.  I'm concerned about his continued uncontrolled diabetes mellitus and obesity.  Recent medication changes were reviewed.  Adrian Prows, MD, [facility] 04/04/2019, 11:18 AM [facility]. [place] Pager: [phone] Office: [phone] If no answer Cell [phone]

## 2019-04-07 ENCOUNTER — Telehealth: Payer: Self-pay

## 2019-04-07 NOTE — Telephone Encounter (Signed)
Copied from [place] [id number]. Topic: Referral - Status >> Apr 07, 2019 0000000 AM Simone Curia D wrote: [id number] Left message on voicemail for patient to return my call regarding information for filing disability. Emailed information to patient. Will call again. Ambrose Mantle [phone]

## 2019-04-20 ENCOUNTER — Telehealth: Payer: Self-pay

## 2019-04-20 NOTE — Telephone Encounter (Signed)
Copied from [place] [phone]. Topic: Referral - Status >> Apr 20, 2019  [id number] PM Simone Curia D wrote: [id number] Left message on voicemail for patient to return my call regarding information for filing disability. Emailed information to patient. Will call again. Ambrose Mantle [phone]

## 2019-04-22 ENCOUNTER — Telehealth: Payer: Self-pay

## 2019-04-22 NOTE — Telephone Encounter (Signed)
Copied from [place] [phone]. Topic: Referral - Status >> Apr 22, 2019  [id number] PM Simone Curia D wrote: [id number] 3rd attempt Left message on voicemail for patient to return my call regarding information for filing disability. Emailed information to patient. Will call again. Ambrose Mantle [phone]

## 2019-06-24 ENCOUNTER — Encounter: Payer: Self-pay | Admitting: Family Medicine

## 2019-06-24 ENCOUNTER — Other Ambulatory Visit: Payer: Self-pay

## 2019-06-24 ENCOUNTER — Ambulatory Visit (INDEPENDENT_AMBULATORY_CARE_PROVIDER_SITE_OTHER): Payer: 59 | Admitting: Family Medicine

## 2019-06-24 VITALS — BP 120/76 | HR 54 | Temp 98.0°F | Ht 69.0 in | Wt 282.1 lb

## 2019-06-24 DIAGNOSIS — I2510 Atherosclerotic heart disease of native coronary artery without angina pectoris: Secondary | ICD-10-CM

## 2019-06-24 DIAGNOSIS — E1159 Type 2 diabetes mellitus with other circulatory complications: Secondary | ICD-10-CM

## 2019-06-24 DIAGNOSIS — E118 Type 2 diabetes mellitus with unspecified complications: Secondary | ICD-10-CM | POA: Diagnosis not present

## 2019-06-24 DIAGNOSIS — E1165 Type 2 diabetes mellitus with hyperglycemia: Secondary | ICD-10-CM

## 2019-06-24 DIAGNOSIS — F332 Major depressive disorder, recurrent severe without psychotic features: Secondary | ICD-10-CM | POA: Diagnosis not present

## 2019-06-24 DIAGNOSIS — Z6841 Body Mass Index (BMI) 40.0 and over, adult: Secondary | ICD-10-CM | POA: Diagnosis not present

## 2019-06-24 DIAGNOSIS — IMO0002 Reserved for concepts with insufficient information to code with codable children: Secondary | ICD-10-CM

## 2019-06-24 DIAGNOSIS — I10 Essential (primary) hypertension: Secondary | ICD-10-CM | POA: Diagnosis not present

## 2019-06-24 LAB — POCT GLYCOSYLATED HEMOGLOBIN (HGB A1C): Hemoglobin A1C: 6.9 % — AB (ref 4.0–5.6)

## 2019-06-24 NOTE — Assessment & Plan Note (Signed)
Chronic, significant improvement with health diet changes and addition of trulicity - congratulated! Pt motivated to continue healthy changes. continue current regimen. Encouraged he schedule eye exam as due. RTC 4-6 mo f/u visit.

## 2019-06-24 NOTE — Assessment & Plan Note (Signed)
Chronic, stable. Continue current regimen. 

## 2019-06-24 NOTE — Assessment & Plan Note (Signed)
Congratulated on weight loss to date - attributes to healthy diet changes - leaning towards vegetarian diet. Motivated to continue this.

## 2019-06-24 NOTE — Progress Notes (Signed)
This visit was conducted in person.  BP 120/76 (BP Location: Left Arm, Patient Position: Sitting, Cuff Size: Large)   Pulse (!) 54   Temp 98 F (36.7 C) (Temporal)   Ht 5\' 9"  (1.753 m)   Wt 282 lb 2 oz (128 kg)   SpO2 95%   BMI 41.66 kg/m    CC: DM f/u visit Subjective:    Patient ID: Adam Patterson, male    DOB: 1960-07-25, 58 y.o.   MRN: [id number]  HPI: Adam Patterson is a 58 y.o. male presenting on 06/24/2019 for Follow-up (Here for 3 mo f/u.  BS this morning, 93.  Requests 90-day refills on all meds. )   DM - does regularly check sugars - fasting 90-110. Compliant with antihyperglycemic regimen which includes: trulicity 0.75mg  weekly, metformin 1000mg  bid. Working on vegetarian diet - oat milk, cinnamon, honey every morning. Congratulated on weight loss noted. Drinking raw apple cider vinegar. One low sugar 1.5 mo ago - none since. Denies paresthesias. Last diabetic eye exam DUE. Pneumovax: 04/2017. Prevnar: not due. Glucometer brand: accuchek. DSME: completed remotely.  Lab Results  Component Value Date   HGBA1C 6.9 (A) 06/24/2019   Diabetic Foot Exam - Simple   Simple Foot Form Diabetic Foot exam was performed with the following findings: Yes 06/24/2019  7:43 AM  Visual Inspection No deformities, no ulcerations, no other skin breakdown bilaterally: Yes Sensation Testing Intact to touch and monofilament testing bilaterally: Yes Pulse Check Posterior Tibialis and Dorsalis pulse intact bilaterally: Yes Comments Dry skin with calluses bilateral soles.     Lab Results  Component Value Date   MICROALBUR 16.0 (H) 04/15/2017    Mood - doing well on celexa 20mg  daily.  Has not yet touched base with care coordinator about disability application - # provided today.      Relevant past medical, surgical, family and social history reviewed and updated as indicated. Interim medical history since our last visit reviewed. Allergies and medications reviewed and  updated. Outpatient Medications Prior to Visit  Medication Sig Dispense Refill  . amLODipine (NORVASC) 10 MG tablet Take 1 tablet (10 mg total) by mouth daily. 90 tablet 3  . aspirin 325 MG tablet Take 325 mg by mouth daily.    . carvedilol (COREG) 6.25 MG tablet TAKE 1 TABLET BY MOUTH TWICE DAILY WITH A MEAL. 180 tablet 3  . Cholecalciferol (VITAMIN D3) 2000 UNITS TABS Take 2,000 Units by mouth 2 (two) times daily.     . citalopram (CELEXA) 20 MG tablet Take 1 tablet (20 mg total) by mouth daily. 90 tablet 1  . Dulaglutide (TRULICITY) A999333 0000000 SOPN Inject 0.75 mg into the skin once a week. 4 pen 3  . fish oil-omega-3 fatty acids 1000 MG capsule Take 1 g by mouth 2 (two) times daily.     Marland Kitchen glucose blood (ACCU-CHEK AVIVA) test strip Use as instructed 100 each 3  . hydrochlorothiazide (HYDRODIURIL) 25 MG tablet TAKE 1 TABLET BY MOUTH DAILY WITH LOSARTAN 90 tablet 3  . losartan (COZAAR) 100 MG tablet TAKE 1 TABLET BY MOUTH DAILY WITH HCTZ 90 tablet 3  . metFORMIN (GLUCOPHAGE) 1000 MG tablet Take 1 tablet (1,000 mg total) by mouth 2 (two) times daily with a meal. 180 tablet 3  . nitroGLYCERIN (NITROSTAT) 0.4 MG SL tablet Place 1 tablet (0.4 mg total) under the tongue every 5 (five) minutes x 3 doses as needed for chest pain. 25 tablet 1  . rosuvastatin (CRESTOR) 40 MG  tablet Take 1 tablet (40 mg total) by mouth daily. 90 tablet 3  . vitamin B-12 (CYANOCOBALAMIN) 1000 MCG tablet Take 1 tablet (1,000 mcg total) by mouth daily.     No facility-administered medications prior to visit.     Per HPI unless specifically indicated in ROS section below Review of Systems Objective:    BP 120/76 (BP Location: Left Arm, Patient Position: Sitting, Cuff Size: Large)   Pulse (!) 54   Temp 98 F (36.7 C) (Temporal)   Ht 5\' 9"  (1.753 m)   Wt 282 lb 2 oz (128 kg)   SpO2 95%   BMI 41.66 kg/m   Wt Readings from Last 3 Encounters:  06/24/19 282 lb 2 oz (128 kg)  04/04/19 295 lb (133.8 kg)  03/25/19  293 lb 3 oz (133 kg)    Physical Exam Vitals and nursing note reviewed.  Constitutional:      General: He is not in acute distress.    Appearance: Normal appearance. He is well-developed. He is obese. He is not ill-appearing.  HENT:     Head: Normocephalic and atraumatic.  Eyes:     General: No scleral icterus.    Extraocular Movements: Extraocular movements intact.     Conjunctiva/sclera: Conjunctivae normal.     Pupils: Pupils are equal, round, and reactive to light.  Cardiovascular:     Rate and Rhythm: Normal rate and regular rhythm.     Pulses: Normal pulses.     Heart sounds: Normal heart sounds. No murmur.  Pulmonary:     Effort: Pulmonary effort is normal. No respiratory distress.     Breath sounds: Normal breath sounds. No wheezing, rhonchi or rales.  Musculoskeletal:     Comments: See HPI for foot exam if done  Skin:    General: Skin is warm and dry.     Findings: No rash.  Neurological:     Mental Status: He is alert.  Psychiatric:        Mood and Affect: Mood normal.        Behavior: Behavior normal.       Results for orders placed or performed in visit on 06/24/19  POCT glycosylated hemoglobin (Hb A1C)  Result Value Ref Range   Hemoglobin A1C 6.9 (A) 4.0 - 5.6 %   HbA1c POC (<> result, manual entry)     HbA1c, POC (prediabetic range)     HbA1c, POC (controlled diabetic range)     Assessment & Plan:  This visit occurred during the SARS-CoV-2 public health emergency.  Safety protocols were in place, including screening questions prior to the visit, additional usage of staff PPE, and extensive cleaning of exam room while observing appropriate contact time as indicated for disinfecting solutions.   Problem List Items Addressed This Visit    MDD (major depressive disorder), recurrent episode, severe ([place])    Stable period on celexa - continue this.       Hypertension associated with diabetes (HCC)    Chronic, stable. Continue current regimen.        Controlled diabetes mellitus type 2 with complications ([place]) - Primary    Chronic, significant improvement with health diet changes and addition of trulicity - congratulated! Pt motivated to continue healthy changes. continue current regimen. Encouraged he schedule eye exam as due. RTC 4-6 mo f/u visit.       CAD (coronary artery disease), native coronary artery    Received recent reassuring report from cardiology (03/2019)      Body  mass index (BMI) of 40.0-44.9 in adult [facility])    Congratulated on weight loss to date - attributes to healthy diet changes - leaning towards vegetarian diet. Motivated to continue this.           No orders of the defined types were placed in this encounter.  Orders Placed This Encounter  Procedures  . POCT glycosylated hemoglobin (Hb A1C)    Patient Instructions  Sugars are wonderfully controlled today! Schedule eye exam if you're due.  Touch base with Ambrose Mantle about disability process [phone] You are doing well today Return as needed or in 4-6 months for follow up visit.    Follow up plan: Return in about 4 months (around 10/23/2019), or if symptoms worsen or fail to improve, for follow up visit.  Ria Bush, MD

## 2019-06-24 NOTE — Assessment & Plan Note (Signed)
Stable period on celexa - continue this.

## 2019-06-24 NOTE — Assessment & Plan Note (Signed)
Received recent reassuring report from cardiology (03/2019)

## 2019-06-24 NOTE — Patient Instructions (Addendum)
Sugars are wonderfully controlled today! Schedule eye exam if you're due.  Touch base with Ambrose Mantle about disability process [phone] You are doing well today Return as needed or in 4-6 months for follow up visit.

## 2019-08-08 ENCOUNTER — Encounter: Payer: Self-pay | Admitting: Family Medicine

## 2019-08-09 NOTE — Telephone Encounter (Signed)
Spoke with [facility] asking about pt's rx.  Told they were filling off of an old rx with #30.  However, there is a new rx on file with #90 and will fill that rx on next refill.  Notified pt via [place].

## 2019-08-22 ENCOUNTER — Other Ambulatory Visit: Payer: Self-pay | Admitting: Family Medicine

## 2019-10-03 ENCOUNTER — Ambulatory Visit: Payer: 59 | Admitting: Cardiology

## 2019-12-30 ENCOUNTER — Ambulatory Visit: Payer: 59 | Admitting: Family Medicine

## 2020-02-13 ENCOUNTER — Other Ambulatory Visit: Payer: Self-pay | Admitting: Family Medicine

## 2020-03-26 ENCOUNTER — Other Ambulatory Visit: Payer: Self-pay | Admitting: Family Medicine

## 2020-04-23 ENCOUNTER — Other Ambulatory Visit: Payer: Self-pay | Admitting: Family Medicine

## 2020-04-30 ENCOUNTER — Other Ambulatory Visit: Payer: Self-pay | Admitting: Family Medicine

## 2020-05-01 NOTE — Telephone Encounter (Signed)
Last OV 06/24/19 Next OV 06/18/20 Last fill for all medications 03/25/19  #90/3

## 2020-06-18 ENCOUNTER — Encounter: Payer: 59 | Admitting: Family Medicine

## 2020-07-02 ENCOUNTER — Telehealth: Payer: Self-pay | Admitting: Family Medicine

## 2020-07-02 MED ORDER — METFORMIN HCL 1000 MG PO TABS: ORAL_TABLET | ORAL | 0 refills | Status: DC

## 2020-07-02 MED ORDER — CARVEDILOL 6.25 MG PO TABS: 6.2500 mg | ORAL_TABLET | Freq: Two times a day (BID) | ORAL | 0 refills | Status: DC

## 2020-07-02 NOTE — Telephone Encounter (Signed)
Pt called in due to he recently moved and he is out of refills for the metformin and carvedilol and the pharmacy told him to call the doctor to have them send a script to the pharmacy.   [facility]: [address] [place] [id number] Ph: [phone]

## 2020-07-02 NOTE — Telephone Encounter (Signed)
E-scribed refills.  

## 2020-08-12 ENCOUNTER — Encounter: Payer: Self-pay | Admitting: Family Medicine

## 2020-08-13 MED ORDER — AMLODIPINE BESYLATE 10 MG PO TABS: 10.0000 mg | ORAL_TABLET | Freq: Every day | ORAL | 0 refills | Status: AC

## 2020-08-13 MED ORDER — CITALOPRAM HYDROBROMIDE 20 MG PO TABS: 20.0000 mg | ORAL_TABLET | Freq: Every day | ORAL | 0 refills | Status: DC

## 2020-08-13 MED ORDER — ROSUVASTATIN CALCIUM 40 MG PO TABS: 40.0000 mg | ORAL_TABLET | Freq: Every day | ORAL | 0 refills | Status: AC

## 2020-08-13 MED ORDER — HYDROCHLOROTHIAZIDE 25 MG PO TABS: ORAL_TABLET | ORAL | 0 refills | Status: AC

## 2020-08-13 MED ORDER — CARVEDILOL 6.25 MG PO TABS: 6.2500 mg | ORAL_TABLET | Freq: Two times a day (BID) | ORAL | 0 refills | Status: AC

## 2020-08-13 MED ORDER — METFORMIN HCL 1000 MG PO TABS: ORAL_TABLET | ORAL | 0 refills | Status: AC

## 2020-08-13 MED ORDER — LOSARTAN POTASSIUM 100 MG PO TABS: ORAL_TABLET | ORAL | 0 refills | Status: AC

## 2020-08-13 NOTE — Telephone Encounter (Signed)
E-scribed refills.  Plz schedule lab and cpe visits to prevent delay in future refills.

## 2020-08-14 ENCOUNTER — Other Ambulatory Visit: Payer: Self-pay | Admitting: Family Medicine

## 2020-08-14 ENCOUNTER — Encounter: Payer: Self-pay | Admitting: Family Medicine

## 2020-08-14 NOTE — Telephone Encounter (Signed)
Attempted to call the patient several times. Unable to reach the patient or leave a vm. Will mail a letter to him. Also, sent a mychart message to him. EM

## 2020-08-14 NOTE — Telephone Encounter (Signed)
Pharmacy requests refill on: Hydrochlorothiazide 25 mg, Losartan 100 mg, Rosuvastatin 40 mg, Amlodipine 10 mg, Carvedilol 6.25 mg, Citalopram 20 mg    LAST REFILL: 08/13/2020  LAST OV: 06/24/2019 NEXT OV: Not Scheduled  PHARMACY: [facility]

## 2020-08-14 NOTE — Telephone Encounter (Signed)
Noted  

## 2020-10-17 ENCOUNTER — Encounter: Payer: Self-pay | Admitting: Family Medicine

## 2020-11-11 ENCOUNTER — Telehealth: Payer: Self-pay | Admitting: Family Medicine

## 2020-11-13 NOTE — Telephone Encounter (Signed)
E-scribed refill.  Plz schedule lab and cpe visits.  

## 2020-11-14 ENCOUNTER — Encounter: Payer: Self-pay | Admitting: Family Medicine

## 2020-11-14 NOTE — Telephone Encounter (Signed)
Called patient 5 times no vm and phone is off. Unable to leave a message will mail a letter to the patient. EM

## 2020-11-14 NOTE — Telephone Encounter (Signed)
Noted  

## 2020-11-22 ENCOUNTER — Other Ambulatory Visit: Payer: Self-pay | Admitting: Family Medicine
